# Patient Record
Sex: Male | Born: 1975
Health system: Southern US, Community
[De-identification: ages and names within clinical notes are randomized; demographics above are authoritative.]

## PROBLEM LIST (undated history)

## (undated) DIAGNOSIS — G479 Sleep disorder, unspecified: Secondary | ICD-10-CM

## (undated) DIAGNOSIS — F419 Anxiety disorder, unspecified: Secondary | ICD-10-CM

## (undated) DIAGNOSIS — I10 Essential (primary) hypertension: Secondary | ICD-10-CM

## (undated) HISTORY — PX: KNEE SURGERY: SHX244

## (undated) HISTORY — PX: OTHER SURGICAL HISTORY: SHX169

## (undated) HISTORY — DX: Anxiety disorder, unspecified: F41.9

## (undated) HISTORY — PX: TONSILLECTOMY AND ADENOIDECTOMY: SUR1326

## (undated) HISTORY — DX: Sleep disorder, unspecified: G47.9

## (undated) HISTORY — DX: Essential (primary) hypertension: I10

## (undated) HISTORY — PX: KIDNEY STONE SURGERY: SHX686

---

## 2015-04-26 ENCOUNTER — Telehealth: Payer: Self-pay | Admitting: Family Medicine

## 2015-04-26 NOTE — Telephone Encounter (Signed)
Appointment given for Friday 6/10 @ 2:55 with Stacks.

## 2015-05-04 ENCOUNTER — Encounter (INDEPENDENT_AMBULATORY_CARE_PROVIDER_SITE_OTHER): Payer: Self-pay

## 2015-05-04 ENCOUNTER — Ambulatory Visit (INDEPENDENT_AMBULATORY_CARE_PROVIDER_SITE_OTHER): Payer: BLUE CROSS/BLUE SHIELD | Admitting: Family Medicine

## 2015-05-04 ENCOUNTER — Encounter: Payer: Self-pay | Admitting: Family Medicine

## 2015-05-04 VITALS — BP 103/61 | HR 79 | Temp 97.1°F | Ht 70.5 in | Wt 215.0 lb

## 2015-05-04 DIAGNOSIS — M1A09X0 Idiopathic chronic gout, multiple sites, without tophus (tophi): Secondary | ICD-10-CM | POA: Diagnosis not present

## 2015-05-04 DIAGNOSIS — I10 Essential (primary) hypertension: Secondary | ICD-10-CM | POA: Diagnosis not present

## 2015-05-04 LAB — POCT CBC
GRANULOCYTE PERCENT: 59.3 % (ref 37–80)
HCT, POC: 44.2 % (ref 43.5–53.7)
HEMOGLOBIN: 14.6 g/dL (ref 14.1–18.1)
Lymph, poc: 2.6 (ref 0.6–3.4)
MCH, POC: 30.6 pg (ref 27–31.2)
MCHC: 33.1 g/dL (ref 31.8–35.4)
MCV: 92.4 fL (ref 80–97)
MPV: 6.9 fL (ref 0–99.8)
POC Granulocyte: 4.6 (ref 2–6.9)
POC LYMPH PERCENT: 33.8 %L (ref 10–50)
Platelet Count, POC: 223 10*3/uL (ref 142–424)
RBC: 4.79 M/uL (ref 4.69–6.13)
RDW, POC: 12.8 %
WBC: 7.8 10*3/uL (ref 4.6–10.2)

## 2015-05-04 MED ORDER — DILTIAZEM HCL ER COATED BEADS 360 MG PO CP24: 360.0000 mg | ORAL_CAPSULE | Freq: Every day | ORAL | Status: DC

## 2015-05-04 MED ORDER — ALLOPURINOL 100 MG PO TABS: 100.0000 mg | ORAL_TABLET | Freq: Every day | ORAL | Status: DC

## 2015-05-04 MED ORDER — CLONAZEPAM 1 MG PO TABS: ORAL_TABLET | ORAL | Status: DC

## 2015-05-04 MED ORDER — VALSARTAN-HYDROCHLOROTHIAZIDE 320-12.5 MG PO TABS: 1.0000 | ORAL_TABLET | Freq: Every morning | ORAL | Status: DC

## 2015-05-04 NOTE — Progress Notes (Signed)
Subjective:  Patient ID: Philip Shepard, male    DOB: Apr 21, 1976  Age: 39 y.o. MRN: [id number]  CC: Follow-up   HPI [name] presents for  follow-up of hypertension. Patient has no history of headache chest pain or shortness of breath or recent cough. Patient also denies symptoms of TIA such as numbness weakness lateralizing. Patient checks  blood pressure at home and has not had any elevated readings recently. Patient denies side effects from his medication. States taking it regularly.  Patient also is under treatment for chronic anxiety. He continues to take clonazepam. As long as he takes this not only doesn't sleep well but he has relief from his chronic anxiety concerns.   History Philip Shepard has a past medical history of Hypertension; Sleep disorder; and Anxiety.   He has past surgical history that includes Knee surgery (Left); carpel tunnel left and right; Kidney stone surgery; and Tonsillectomy and adenoidectomy.   His family history includes Arthritis in his father and mother; Cancer in his mother and paternal grandmother; Diabetes in his mother.He reports that he has been smoking Cigarettes.  He does not have any smokeless tobacco history on file. He reports that he does not drink alcohol or use illicit drugs.  No current outpatient prescriptions on file prior to visit.   No current facility-administered medications on file prior to visit.    ROS Review of Systems  Constitutional: Negative for fever, chills and diaphoresis.  HENT: Negative for congestion, rhinorrhea and sore throat.   Respiratory: Negative for cough, shortness of breath and wheezing.   Cardiovascular: Negative for chest pain.  Gastrointestinal: Negative for nausea, vomiting, abdominal pain, diarrhea, constipation and abdominal distention.  Genitourinary: Negative for dysuria and frequency.  Musculoskeletal: Negative for joint swelling and arthralgias.  Skin: Negative for rash.  Neurological: Negative for  headaches.    Objective:  BP 103/61 mmHg  Pulse 79  Temp(Src) 97.1 F (36.2 C) (Oral)  Ht 5' 10.5" (1.791 m)  Wt 215 lb (97.523 kg)  BMI 30.40 kg/m2  BP Readings from Last 3 Encounters:  05/04/15 103/61    Wt Readings from Last 3 Encounters:  05/04/15 215 lb (97.523 kg)     Physical Exam  Constitutional: He is oriented to person, place, and time. He appears well-developed and well-nourished. No distress.  HENT:  Head: Normocephalic and atraumatic.  Right Ear: External ear normal.  Left Ear: External ear normal.  Nose: Nose normal.  Mouth/Throat: Oropharynx is clear and moist.  Eyes: Conjunctivae and EOM are normal. Pupils are equal, round, and reactive to light.  Neck: Normal range of motion. Neck supple. No thyromegaly present.  Cardiovascular: Normal rate, regular rhythm and normal heart sounds.   No murmur heard. Pulmonary/Chest: Effort normal and breath sounds normal. No respiratory distress. He has no wheezes. He has no rales.  Abdominal: Soft. Bowel sounds are normal. He exhibits no distension. There is no tenderness.  Lymphadenopathy:    He has no cervical adenopathy.  Neurological: He is alert and oriented to person, place, and time. He has normal reflexes.  Skin: Skin is warm and dry.  Psychiatric: He has a normal mood and affect. His behavior is normal. Judgment and thought content normal.    No results found for: HGBA1C  No results found for: WBC, HGB, HCT, PLT, GLUCOSE, CHOL, TRIG, HDL, LDLDIRECT, LDLCALC, ALT, AST, NA, K, CL, CREATININE, BUN, CO2, TSH, PSA, INR, GLUF, HGBA1C, MICROALBUR  Patient was never admitted.  Assessment & Plan:   Quinterrius was  seen today for follow-up.  Diagnoses and all orders for this visit:  Essential hypertension Orders: -     POCT CBC -     CMP14+EGFR  Idiopathic chronic gout of multiple sites without tophus Orders: -     Uric acid  Other orders -     valsartan-hydrochlorothiazide (DIOVAN-HCT) 320-12.5 MG per tablet;  Take 1 tablet by mouth every morning. -     diltiazem (CARDIZEM CD) 360 MG 24 hr capsule; Take 1 capsule (360 mg total) by mouth daily. -     clonazePAM (KLONOPIN) 1 MG tablet; TAKE 1 TO 1 & 1/2 TABS APPROXIMATELY 30 MINUTES TO 1 HOUR BEFORE BEDTIME -     allopurinol (ZYLOPRIM) 100 MG tablet; Take 1 tablet (100 mg total) by mouth daily.  I have changed Mr. Philip Shepard's diltiazem and allopurinol. I am also having him maintain his valsartan-hydrochlorothiazide and clonazePAM.  Meds ordered this encounter  Medications  . DISCONTD: diltiazem (CARDIZEM CD) 360 MG 24 hr capsule    Sig: Take 1 capsule by mouth daily.    Refill:  11  . DISCONTD: clonazePAM (KLONOPIN) 1 MG tablet    Sig: TAKE 1 TO 1 & 1/2 TABS APPROXIMATELY 30 MINUTES TO 1 HOUR BEFORE BEDTIME    Refill:  5  . DISCONTD: allopurinol (ZYLOPRIM) 100 MG tablet    Sig: Take 1 tablet by mouth daily.    Refill:  1  . DISCONTD: valsartan-hydrochlorothiazide (DIOVAN-HCT) 320-12.5 MG per tablet    Sig: Take 1 tablet by mouth every morning.    Refill:  3  . valsartan-hydrochlorothiazide (DIOVAN-HCT) 320-12.5 MG per tablet    Sig: Take 1 tablet by mouth every morning.    Dispense:  90 tablet    Refill:  3  . diltiazem (CARDIZEM CD) 360 MG 24 hr capsule    Sig: Take 1 capsule (360 mg total) by mouth daily.    Dispense:  90 capsule    Refill:  3  . clonazePAM (KLONOPIN) 1 MG tablet    Sig: TAKE 1 TO 1 & 1/2 TABS APPROXIMATELY 30 MINUTES TO 1 HOUR BEFORE BEDTIME    Dispense:  90 tablet    Refill:  1  . allopurinol (ZYLOPRIM) 100 MG tablet    Sig: Take 1 tablet (100 mg total) by mouth daily.    Dispense:  90 tablet    Refill:  3     Follow-up: No Follow-up on file.  Claretta Fraise, M.D.

## 2015-05-05 LAB — CMP14+EGFR
ALT: 23 IU/L (ref 0–44)
AST: 17 IU/L (ref 0–40)
Albumin/Globulin Ratio: 1.8 (ref 1.1–2.5)
Albumin: 4.5 g/dL (ref 3.5–5.5)
Alkaline Phosphatase: 64 IU/L (ref 39–117)
BILIRUBIN TOTAL: 0.3 mg/dL (ref 0.0–1.2)
BUN / CREAT RATIO: 17 (ref 8–19)
BUN: 17 mg/dL (ref 6–20)
CALCIUM: 9.5 mg/dL (ref 8.7–10.2)
CO2: 25 mmol/L (ref 18–29)
CREATININE: 1.02 mg/dL (ref 0.76–1.27)
Chloride: 99 mmol/L (ref 97–108)
GFR calc Af Amer: 109 mL/min/{1.73_m2} (ref >59)
GFR, EST NON AFRICAN AMERICAN: 94 mL/min/{1.73_m2} (ref >59)
GLOBULIN, TOTAL: 2.5 g/dL (ref 1.5–4.5)
Glucose: 69 mg/dL (ref 65–99)
Potassium: 4.5 mmol/L (ref 3.5–5.2)
Sodium: 141 mmol/L (ref 134–144)
Total Protein: 7 g/dL (ref 6.0–8.5)

## 2015-05-05 LAB — URIC ACID: Uric Acid: 7.6 mg/dL (ref 3.7–8.6)

## 2015-05-07 ENCOUNTER — Telehealth: Payer: Self-pay | Admitting: *Deleted

## 2015-05-07 NOTE — Telephone Encounter (Signed)
-----   Message from Mechele Claude, MD sent at 05/05/2015 12:29 PM EDT ----- Nelda Severe,    Your lab result is normal.Some minor variations that are not significant are commonly marked abnormal, but do not represent any medical problem for you.  Best regards, Mechele Claude, M.D.

## 2015-05-07 NOTE — Progress Notes (Signed)
PATIENT AWARE

## 2015-05-07 NOTE — Telephone Encounter (Signed)
lmtcb regarding test results. 

## 2015-09-14 ENCOUNTER — Ambulatory Visit (INDEPENDENT_AMBULATORY_CARE_PROVIDER_SITE_OTHER): Payer: BLUE CROSS/BLUE SHIELD | Admitting: Family Medicine

## 2015-09-14 ENCOUNTER — Encounter: Payer: Self-pay | Admitting: Family Medicine

## 2015-09-14 ENCOUNTER — Ambulatory Visit (INDEPENDENT_AMBULATORY_CARE_PROVIDER_SITE_OTHER): Payer: BLUE CROSS/BLUE SHIELD

## 2015-09-14 VITALS — BP 108/61 | HR 70 | Temp 97.2°F | Ht 70.5 in | Wt 211.4 lb

## 2015-09-14 DIAGNOSIS — M5442 Lumbago with sciatica, left side: Secondary | ICD-10-CM

## 2015-09-14 DIAGNOSIS — I10 Essential (primary) hypertension: Secondary | ICD-10-CM

## 2015-09-14 DIAGNOSIS — Z23 Encounter for immunization: Secondary | ICD-10-CM | POA: Diagnosis not present

## 2015-09-14 DIAGNOSIS — F411 Generalized anxiety disorder: Secondary | ICD-10-CM | POA: Diagnosis not present

## 2015-09-14 MED ORDER — DILTIAZEM HCL ER COATED BEADS 360 MG PO CP24: 360.0000 mg | ORAL_CAPSULE | Freq: Every day | ORAL | Status: DC

## 2015-09-14 MED ORDER — CYCLOBENZAPRINE HCL 10 MG PO TABS: 10.0000 mg | ORAL_TABLET | Freq: Three times a day (TID) | ORAL | Status: DC | PRN

## 2015-09-14 MED ORDER — CLONAZEPAM 1 MG PO TABS: ORAL_TABLET | ORAL | Status: DC

## 2015-09-14 MED ORDER — METHYLPREDNISOLONE 8 MG PO TABS: ORAL_TABLET | ORAL | Status: DC

## 2015-09-14 MED ORDER — ALLOPURINOL 100 MG PO TABS: 100.0000 mg | ORAL_TABLET | Freq: Every day | ORAL | Status: DC

## 2015-09-14 MED ORDER — TRAMADOL HCL 50 MG PO TABS: 50.0000 mg | ORAL_TABLET | Freq: Four times a day (QID) | ORAL | Status: DC | PRN

## 2015-09-14 MED ORDER — VALSARTAN-HYDROCHLOROTHIAZIDE 320-12.5 MG PO TABS: 1.0000 | ORAL_TABLET | Freq: Every morning | ORAL | Status: DC

## 2015-09-14 NOTE — Progress Notes (Signed)
Subjective:  Patient ID: Philip Shepard, male    DOB: March 18, 1976  Age: 39 y.o. MRN: [id number]  CC: Hypertension and Insomnia   HPI Philip Shepard presents for sciatic nerve pain. Left buttocks pain is lancinating running down the back of the left thigh. It is 6/10. It is interfering with sleep. He is tired all the time. Patient reports that he is also stressed out and anxious and upset because of his daughter beginning her periods and she is having dysmenorrhea and wanting to spend more time with her mother. Since she is daddy's girl this is hurting his feelings and he is also worried about her pain, of course.   follow-up of hypertension. Patient has no history of headache chest pain or shortness of breath or recent cough. Patient also denies symptoms of TIA such as numbness weakness lateralizing. Patient checks  blood pressure at home and has not had any elevated readings recently. Patient denies side effects from his medication. States taking it regularly.   History Philip Shepard has a past medical history of Hypertension; Sleep disorder; and Anxiety.   He has past surgical history that includes Knee surgery (Left); carpel tunnel left and right; Kidney stone surgery; and Tonsillectomy and adenoidectomy.   His family history includes Arthritis in his father and mother; Cancer in his mother and paternal grandmother; Diabetes in his mother.He reports that he has been smoking Cigarettes.  He does not have any smokeless tobacco history on file. He reports that he does not drink alcohol or use illicit drugs.  Outpatient Prescriptions Prior to Visit  Medication Sig Dispense Refill  . allopurinol (ZYLOPRIM) 100 MG tablet Take 1 tablet (100 mg total) by mouth daily. 90 tablet 3  . clonazePAM (KLONOPIN) 1 MG tablet TAKE 1 TO 1 & 1/2 TABS APPROXIMATELY 30 MINUTES TO 1 HOUR BEFORE BEDTIME 90 tablet 1  . diltiazem (CARDIZEM CD) 360 MG 24 hr capsule Take 1 capsule (360 mg total) by mouth daily. 90 capsule 3  .  valsartan-hydrochlorothiazide (DIOVAN-HCT) 320-12.5 MG per tablet Take 1 tablet by mouth every morning. 90 tablet 3   No facility-administered medications prior to visit.    ROS Review of Systems  Constitutional: Negative for fever, chills and diaphoresis.  HENT: Negative for congestion, rhinorrhea and sore throat.   Respiratory: Negative for cough, shortness of breath and wheezing.   Cardiovascular: Negative for chest pain.  Gastrointestinal: Negative for nausea, vomiting, abdominal pain, diarrhea, constipation and abdominal distention.  Genitourinary: Negative for dysuria and frequency.  Musculoskeletal: Positive for myalgias and back pain. Negative for joint swelling and arthralgias.  Skin: Negative for rash.  Neurological: Positive for numbness (, Accompanying sciatic pain). Negative for headaches.  Psychiatric/Behavioral: Positive for sleep disturbance and agitation. The patient is nervous/anxious.     Objective:  BP 108/61 mmHg  Pulse 70  Temp(Src) 97.2 F (36.2 C) (Oral)  Ht 5' 10.5" (1.791 m)  Wt 211 lb 6.4 oz (95.89 kg)  BMI 29.89 kg/m2  BP Readings from Last 3 Encounters:  09/14/15 108/61  05/04/15 103/61    Wt Readings from Last 3 Encounters:  09/14/15 211 lb 6.4 oz (95.89 kg)  05/04/15 215 lb (97.523 kg)     Physical Exam  No results found for: HGBA1C  Lab Results  Component Value Date   WBC 7.8 05/04/2015   HGB 14.6 05/04/2015   HCT 44.2 05/04/2015   GLUCOSE 69 05/04/2015   ALT 23 05/04/2015   AST 17 05/04/2015   NA 141 05/04/2015  K 4.5 05/04/2015   CL 99 05/04/2015   CREATININE 1.02 05/04/2015   BUN 17 05/04/2015   CO2 25 05/04/2015    Patient was never admitted.  Assessment & Plan:   Philip Shepard was seen today for hypertension and insomnia.  Diagnoses and all orders for this visit:  Encounter for immunization  Left-sided low back pain with left-sided sciatica -     DG Lumbar Spine 2-3 Views; Future -     Nerve conduction test;  Future  Essential hypertension -     DG Lumbar Spine 2-3 Views; Future  Generalized anxiety disorder -     DG Lumbar Spine 2-3 Views; Future  Other orders -     clonazePAM (KLONOPIN) 1 MG tablet; TAKE 1 TO 1 & 1/2 TABS APPROXIMATELY 30 MINUTES TO 1 HOUR BEFORE BEDTIME -     Flu Vaccine QUAD 36+ mos IM -     diltiazem (CARDIZEM CD) 360 MG 24 hr capsule; Take 1 capsule (360 mg total) by mouth daily. -     valsartan-hydrochlorothiazide (DIOVAN-HCT) 320-12.5 MG tablet; Take 1 tablet by mouth every morning. -     allopurinol (ZYLOPRIM) 100 MG tablet; Take 1 tablet (100 mg total) by mouth daily. -     traMADol (ULTRAM) 50 MG tablet; Take 1 tablet (50 mg total) by mouth every 6 (six) hours as needed. -     methylPREDNISolone (MEDROL) 8 MG tablet; Take 5 daily for 3 days then 4 for 3 days then 3, 2, 1 for 3 days each -     cyclobenzaprine (FLEXERIL) 10 MG tablet; Take 1 tablet (10 mg total) by mouth 3 (three) times daily as needed for muscle spasms.   I have changed Philip Shepard's valsartan-hydrochlorothiazide. I am also having him start on traMADol, methylPREDNISolone, and cyclobenzaprine. Additionally, I am having him maintain his clonazePAM, diltiazem, and allopurinol.  Meds ordered this encounter  Medications  . clonazePAM (KLONOPIN) 1 MG tablet    Sig: TAKE 1 TO 1 & 1/2 TABS APPROXIMATELY 30 MINUTES TO 1 HOUR BEFORE BEDTIME    Dispense:  90 tablet    Refill:  1  . diltiazem (CARDIZEM CD) 360 MG 24 hr capsule    Sig: Take 1 capsule (360 mg total) by mouth daily.    Dispense:  90 capsule    Refill:  3  . valsartan-hydrochlorothiazide (DIOVAN-HCT) 320-12.5 MG tablet    Sig: Take 1 tablet by mouth every morning.    Dispense:  90 tablet    Refill:  3  . allopurinol (ZYLOPRIM) 100 MG tablet    Sig: Take 1 tablet (100 mg total) by mouth daily.    Dispense:  90 tablet    Refill:  3  . traMADol (ULTRAM) 50 MG tablet    Sig: Take 1 tablet (50 mg total) by mouth every 6 (six) hours as needed.     Dispense:  50 tablet    Refill:  1  . methylPREDNISolone (MEDROL) 8 MG tablet    Sig: Take 5 daily for 3 days then 4 for 3 days then 3, 2, 1 for 3 days each    Dispense:  45 tablet    Refill:  0  . cyclobenzaprine (FLEXERIL) 10 MG tablet    Sig: Take 1 tablet (10 mg total) by mouth 3 (three) times daily as needed for muscle spasms.    Dispense:  90 tablet    Refill:  1     Follow-up: Return in about 2 weeks (  around 09/28/2015) for Pain.  Mechele Claude, M.D.

## 2015-09-14 NOTE — Patient Instructions (Signed)

## 2016-01-25 ENCOUNTER — Other Ambulatory Visit: Payer: Self-pay | Admitting: Family Medicine

## 2016-01-28 NOTE — Telephone Encounter (Signed)
Last seen and filled 09/14/15. Call in

## 2016-01-29 MED ORDER — CLONAZEPAM 1 MG PO TABS: ORAL_TABLET | ORAL | Status: DC

## 2016-01-29 MED ORDER — DILTIAZEM HCL ER COATED BEADS 360 MG PO CP24: 360.0000 mg | ORAL_CAPSULE | Freq: Every day | ORAL | Status: DC

## 2016-01-29 MED ORDER — VALSARTAN-HYDROCHLOROTHIAZIDE 320-12.5 MG PO TABS: 1.0000 | ORAL_TABLET | Freq: Every morning | ORAL | Status: DC

## 2016-01-29 MED ORDER — ALLOPURINOL 100 MG PO TABS: 100.0000 mg | ORAL_TABLET | Freq: Every day | ORAL | Status: DC

## 2016-01-29 NOTE — Telephone Encounter (Signed)
Refill called to [facility] 

## 2016-02-15 ENCOUNTER — Ambulatory Visit: Payer: BLUE CROSS/BLUE SHIELD | Admitting: Family Medicine

## 2016-02-22 ENCOUNTER — Other Ambulatory Visit: Payer: Self-pay | Admitting: Family Medicine

## 2016-02-22 ENCOUNTER — Ambulatory Visit (INDEPENDENT_AMBULATORY_CARE_PROVIDER_SITE_OTHER): Payer: BLUE CROSS/BLUE SHIELD | Admitting: Family Medicine

## 2016-02-22 ENCOUNTER — Encounter: Payer: Self-pay | Admitting: Family Medicine

## 2016-02-22 VITALS — BP 116/63 | HR 95 | Temp 97.8°F | Ht 70.5 in | Wt 214.6 lb

## 2016-02-22 DIAGNOSIS — I10 Essential (primary) hypertension: Secondary | ICD-10-CM

## 2016-02-22 DIAGNOSIS — M5442 Lumbago with sciatica, left side: Secondary | ICD-10-CM

## 2016-02-22 DIAGNOSIS — F411 Generalized anxiety disorder: Secondary | ICD-10-CM | POA: Diagnosis not present

## 2016-02-22 MED ORDER — BUSPIRONE HCL 15 MG PO TABS: 15.0000 mg | ORAL_TABLET | Freq: Two times a day (BID) | ORAL | Status: DC

## 2016-02-22 MED ORDER — METAXALONE 800 MG PO TABS: 800.0000 mg | ORAL_TABLET | Freq: Three times a day (TID) | ORAL | Status: DC

## 2016-02-22 MED ORDER — METHYLPREDNISOLONE 8 MG PO TABS: ORAL_TABLET | ORAL | Status: DC

## 2016-02-22 NOTE — Progress Notes (Signed)
Subjective:  Patient ID: Philip Shepard, male    DOB: 05-31-1976  Age: 40 y.o. MRN: [id number]  CC: Hypertension and GAD   HPI Philip Shepard presents for  follow-up of hypertension. Patient has no history of headache chest pain or shortness of breath or recent cough. Patient also denies symptoms of TIA such as numbness weakness lateralizing. Patient checks  blood pressure at home and has not had any elevated readings recently. Patient denies side effects from medication. States taking it regularly.  GAD 7 : Generalized Anxiety Score 02/22/2016  Nervous, Anxious, on Edge 3  Control/stop worrying 3  Worry too much - different things 3  Trouble relaxing 3  Restless 3  Easily annoyed or irritable 3  Afraid - awful might happen 0  Total GAD 7 Score 18  Anxiety Difficulty Somewhat difficult     Anxiety has been heightened significantly by his mother's recent diagnosis of breast cancer. He feels that the surgeon is dragging his feet about operating. He doesn't understand why she is seeing chemotherapy radiation and getting another MRI. Additionally anxiety is been heightened by his back pain.  Back pain remains in the left lower back. It is frequently 11/10. No relief with Flexeril or tramadol. The steroid helped a lot but it started coming back within a week after finishing the steroid. He tried physical therapy and that seemed to make it worse. He is doing some working out in the mornings before work doing some stretches and light weightlifting as well as cardio. That seems to give him enough flexibility and relief so that he can make it through his work shift. Sometimes he can get relieved just by sitting still and getting comfortable in a recliner with the pillow that just so behind his back. Of note is that the radiation is down the left buttocks into the posterior thigh. It also rates 11/10. Pain is sharp and stabbing at the paraspinous L4 region where he points.   History Philip Shepard has a past  medical history of Hypertension; Sleep disorder; and Anxiety.   He has past surgical history that includes Knee surgery (Left); carpel tunnel left and right; Kidney stone surgery; and Tonsillectomy and adenoidectomy.   His family history includes Arthritis in his father and mother; Cancer in his mother and paternal grandmother; Diabetes in his mother.He reports that he has been smoking Cigarettes.  He does not have any smokeless tobacco history on file. He reports that he does not drink alcohol or use illicit drugs.  Current Outpatient Prescriptions on File Prior to Visit  Medication Sig Dispense Refill  . allopurinol (ZYLOPRIM) 100 MG tablet Take 1 tablet (100 mg total) by mouth daily. 90 tablet 0  . clonazePAM (KLONOPIN) 1 MG tablet TAKE 1 TO 1 & 1/2 TABS APPROXIMATELY 30 MINUTES TO 1 HOUR BEFORE BEDTIME 90 tablet 1  . diltiazem (CARDIZEM CD) 360 MG 24 hr capsule Take 1 capsule (360 mg total) by mouth daily. 90 capsule 0  . valsartan-hydrochlorothiazide (DIOVAN-HCT) 320-12.5 MG tablet Take 1 tablet by mouth every morning. 90 tablet 0   No current facility-administered medications on file prior to visit.    ROS Review of Systems  Constitutional: Negative for fever, chills and diaphoresis.  HENT: Negative for rhinorrhea and sore throat.   Respiratory: Negative for cough and shortness of breath.   Cardiovascular: Negative for chest pain.  Gastrointestinal: Negative for abdominal pain.  Musculoskeletal: Negative for myalgias and arthralgias.  Skin: Negative for rash.  Neurological: Negative for weakness  and headaches.    Objective:  BP 116/63 mmHg  Pulse 95  Temp(Src) 97.8 F (36.6 C) (Oral)  Ht 5' 10.5" (1.791 m)  Wt 214 lb 9.6 oz (97.342 kg)  BMI 30.35 kg/m2  SpO2 98%  BP Readings from Last 3 Encounters:  02/22/16 116/63  09/14/15 108/61  05/04/15 103/61    Wt Readings from Last 3 Encounters:  02/22/16 214 lb 9.6 oz (97.342 kg)  09/14/15 211 lb 6.4 oz (95.89 kg)    05/04/15 215 lb (97.523 kg)     Physical Exam  Constitutional: He is oriented to person, place, and time. He appears well-developed and well-nourished. No distress.  HENT:  Head: Normocephalic and atraumatic.  Right Ear: External ear normal.  Left Ear: External ear normal.  Nose: Nose normal.  Mouth/Throat: Oropharynx is clear and moist.  Eyes: Conjunctivae and EOM are normal. Pupils are equal, round, and reactive to light.  Neck: Normal range of motion. Neck supple. No thyromegaly present.  Cardiovascular: Normal rate, regular rhythm and normal heart sounds.   No murmur heard. Pulmonary/Chest: Effort normal and breath sounds normal. No respiratory distress. He has no wheezes. He has no rales.  Abdominal: Soft. Bowel sounds are normal. He exhibits no distension. There is no tenderness.  Musculoskeletal: He exhibits tenderness (marked tenderness with palpable spasm in the lumbar spine hours at L4 level. There is a positive straight leg raise on the left. The left lower extremity is neurovascularly intact.).  Lymphadenopathy:    He has no cervical adenopathy.  Neurological: He is alert and oriented to person, place, and time. He has normal reflexes.  Skin: Skin is warm and dry.  Psychiatric: He has a normal mood and affect. His behavior is normal. Judgment and thought content normal.     Lab Results  Component Value Date   WBC 7.8 05/04/2015   HGB 14.6 05/04/2015   HCT 44.2 05/04/2015   GLUCOSE 69 05/04/2015   ALT 23 05/04/2015   AST 17 05/04/2015   NA 141 05/04/2015   K 4.5 05/04/2015   CL 99 05/04/2015   CREATININE 1.02 05/04/2015   BUN 17 05/04/2015   CO2 25 05/04/2015    Patient was never admitted.  Assessment & Plan:   Philip Shepard was seen today for hypertension and gad.  Diagnoses and all orders for this visit:  Left-sided low back pain with left-sided sciatica -     MR Lumbar Spine Wo Contrast; Future -     Ambulatory referral to Orthopedic Surgery  Essential  hypertension  Generalized anxiety disorder  Other orders -     metaxalone (SKELAXIN) 800 MG tablet; Take 1 tablet (800 mg total) by mouth 3 (three) times daily. To relax muscles -     busPIRone (BUSPAR) 15 MG tablet; Take 1 tablet (15 mg total) by mouth 2 (two) times daily. For anxiety -     methylPREDNISolone (MEDROL) 8 MG tablet; Take 5 daily for 3 days then 4 for 3 days then 3, 2, 1 for 3 days each   I have discontinued Philip Shepard's traMADol and cyclobenzaprine. I am also having him start on metaxalone and busPIRone. Additionally, I am having him maintain his valsartan-hydrochlorothiazide, diltiazem, allopurinol, clonazePAM, and methylPREDNISolone.  Meds ordered this encounter  Medications  . metaxalone (SKELAXIN) 800 MG tablet    Sig: Take 1 tablet (800 mg total) by mouth 3 (three) times daily. To relax muscles    Dispense:  90 tablet    Refill:  5  .  busPIRone (BUSPAR) 15 MG tablet    Sig: Take 1 tablet (15 mg total) by mouth 2 (two) times daily. For anxiety    Dispense:  60 tablet    Refill:  2  . methylPREDNISolone (MEDROL) 8 MG tablet    Sig: Take 5 daily for 3 days then 4 for 3 days then 3, 2, 1 for 3 days each    Dispense:  45 tablet    Refill:  0      Follow-up: Return in about 6 weeks (around 04/04/2016).  Mechele Claude, M.D.

## 2016-04-01 ENCOUNTER — Telehealth: Payer: Self-pay | Admitting: Family Medicine

## 2016-04-01 NOTE — Telephone Encounter (Signed)
I will contact him after finishing seeing patients

## 2016-04-01 NOTE — Telephone Encounter (Signed)
Thank you :)

## 2016-04-02 ENCOUNTER — Telehealth: Payer: Self-pay | Admitting: Family Medicine

## 2016-04-02 NOTE — Telephone Encounter (Signed)
Spoke to pt's wife and gave recommendation per Dr Darlyn ReadStacks She would like for Dr Darlyn ReadStacks to call

## 2016-04-02 NOTE — Telephone Encounter (Signed)
Tell her that Philip Shepard has 2 herniated discs that are squeezing nerves in his back. I recommended he see a neurosurgeon. He said he wanted to think about it and would let me know what he decides. Referral hasn't been made yet. Waiting on him.

## 2016-04-04 ENCOUNTER — Other Ambulatory Visit: Payer: Self-pay | Admitting: Family Medicine

## 2016-04-04 DIAGNOSIS — M5416 Radiculopathy, lumbar region: Secondary | ICD-10-CM

## 2016-04-10 ENCOUNTER — Encounter: Payer: Self-pay | Admitting: Family Medicine

## 2016-04-23 ENCOUNTER — Telehealth: Payer: Self-pay | Admitting: Family Medicine

## 2016-04-23 NOTE — Telephone Encounter (Signed)
Pt is requesting pain med for back pain Pain is continuing to get worse Please review and advise

## 2016-04-23 NOTE — Telephone Encounter (Signed)
In order to consider this patient request, the patient will need to see a provider, preferably their PCP. 

## 2016-04-23 NOTE — Telephone Encounter (Signed)
Informed wife of Dr Sharen HonesStack's recommendation Pt will call back to schedule appt

## 2016-04-25 ENCOUNTER — Ambulatory Visit (INDEPENDENT_AMBULATORY_CARE_PROVIDER_SITE_OTHER): Payer: BLUE CROSS/BLUE SHIELD | Admitting: Family Medicine

## 2016-04-25 ENCOUNTER — Encounter: Payer: Self-pay | Admitting: Family Medicine

## 2016-04-25 VITALS — BP 124/73 | HR 100 | Temp 97.9°F | Ht 71.0 in | Wt 215.4 lb

## 2016-04-25 DIAGNOSIS — M5126 Other intervertebral disc displacement, lumbar region: Secondary | ICD-10-CM | POA: Diagnosis not present

## 2016-04-25 MED ORDER — GABAPENTIN 300 MG PO CAPS: ORAL_CAPSULE | ORAL | Status: DC

## 2016-04-25 MED ORDER — HYDROCODONE-ACETAMINOPHEN 5-325 MG PO TABS: 1.0000 | ORAL_TABLET | Freq: Four times a day (QID) | ORAL | Status: DC | PRN

## 2016-04-25 NOTE — Progress Notes (Signed)
Subjective:  Patient ID: Philip Shepard, male    DOB: 03/20/1976  Age: 40 y.o. MRN: [id number]  CC: Back Pain   HPI Philip Shepard presents for 10/10 back pain radiating bilaterally into posterior thighs. Recent Dx with herniated NP with nerve root compression. Pt. Very concerned about surgery, but now says he has to do something. Needs short term pain relief until he can see neurosurgeon.Pain is relieved by laying down. It is exacerbated by being on his feet for an extended time daily. It is a pressure. He feels like there is a ball pushing into his back when he sits. He can't sit for very long. He can't stand for very long. He can't walk or ambulate for very long. He has to change position frequently. The pain is occurring at least 3 days a week sometimes every day.  History Philip Shepard has a past medical history of Hypertension; Sleep disorder; and Anxiety.   He has past surgical history that includes Knee surgery (Left); carpel tunnel left and right; Kidney stone surgery; and Tonsillectomy and adenoidectomy.   His family history includes Arthritis in his father and mother; Cancer in his mother and paternal grandmother; Diabetes in his mother.He reports that he has been smoking Cigarettes.  He does not have any smokeless tobacco history on file. He reports that he does not drink alcohol or use illicit drugs.  Current Outpatient Prescriptions on File Prior to Visit  Medication Sig Dispense Refill  . allopurinol (ZYLOPRIM) 100 MG tablet Take 1 tablet (100 mg total) by mouth daily. 90 tablet 0  . clonazePAM (KLONOPIN) 1 MG tablet TAKE 1 TO 1 & 1/2 TABS APPROXIMATELY 30 MINUTES TO 1 HOUR BEFORE BEDTIME 90 tablet 1  . diltiazem (CARDIZEM CD) 360 MG 24 hr capsule Take 1 capsule (360 mg total) by mouth daily. 90 capsule 0  . metaxalone (SKELAXIN) 800 MG tablet Take 1 tablet (800 mg total) by mouth 3 (three) times daily. To relax muscles 90 tablet 5  . valsartan-hydrochlorothiazide (DIOVAN-HCT) 320-12.5 MG  tablet Take 1 tablet by mouth every morning. 90 tablet 0  . busPIRone (BUSPAR) 15 MG tablet Take 1 tablet (15 mg total) by mouth 2 (two) times daily. For anxiety (Patient not taking: Reported on 04/25/2016) 60 tablet 2  . cyclobenzaprine (FLEXERIL) 10 MG tablet TAKE 1 TABLET (10 MG TOTAL) BY MOUTH 3 (THREE) TIMES DAILY AS NEEDED FOR MUSCLE SPASMS. (Patient not taking: Reported on 04/25/2016) 90 tablet 2   No current facility-administered medications on file prior to visit.    ROS Review of Systems  Constitutional: Negative for fever, chills, diaphoresis and unexpected weight change.  HENT: Negative for congestion, hearing loss, rhinorrhea and sore throat.   Eyes: Negative for visual disturbance.  Respiratory: Negative for cough and shortness of breath.   Cardiovascular: Negative for chest pain.  Gastrointestinal: Negative for abdominal pain, diarrhea and constipation.  Genitourinary: Negative for dysuria and flank pain.  Musculoskeletal: Positive for myalgias, back pain, joint swelling and arthralgias.  Skin: Negative for rash.  Neurological: Negative for dizziness and headaches.  Psychiatric/Behavioral: Negative for sleep disturbance and dysphoric mood.    Objective:  BP 124/73 mmHg  Pulse 100  Temp(Src) 97.9 F (36.6 C) (Oral)  Ht 5\' 11"  (1.803 m)  Wt 215 lb 6.4 oz (97.705 kg)  BMI 30.06 kg/m2  SpO2 99%  Physical Exam  Constitutional: He is oriented to person, place, and time. He appears well-developed and well-nourished. No distress.  HENT:  Head: Normocephalic and atraumatic.  Right Ear: External ear normal.  Left Ear: External ear normal.  Nose: Nose normal.  Mouth/Throat: Oropharynx is clear and moist.  Eyes: Conjunctivae and EOM are normal. Pupils are equal, round, and reactive to light.  Neck: Normal range of motion. Neck supple. No thyromegaly present.  Cardiovascular: Normal rate, regular rhythm and normal heart sounds.   No murmur heard. Pulmonary/Chest: Effort  normal and breath sounds normal. No respiratory distress. He has no wheezes. He has no rales.  Abdominal: Soft. Bowel sounds are normal. He exhibits no distension. There is no tenderness.  Lymphadenopathy:    He has no cervical adenopathy.  Neurological: He is alert and oriented to person, place, and time. He has normal reflexes.  Skin: Skin is warm and dry.  Psychiatric: He has a normal mood and affect. His behavior is normal. Judgment and thought content normal.    Assessment & Plan:   Taesean was seen today for back pain.  Diagnoses and all orders for this visit:  HNP (herniated nucleus pulposus), lumbar -     Ambulatory referral to Neurosurgery  Other orders -     HYDROcodone-acetaminophen (NORCO) 5-325 MG tablet; Take 1 tablet by mouth every 6 (six) hours as needed for moderate pain.   I have discontinued Mr. Charlet's methylPREDNISolone. I am also having him start on HYDROcodone-acetaminophen. Additionally, I am having him maintain his valsartan-hydrochlorothiazide, diltiazem, allopurinol, clonazePAM, metaxalone, busPIRone, and cyclobenzaprine.  Meds ordered this encounter  Medications  . HYDROcodone-acetaminophen (NORCO) 5-325 MG tablet    Sig: Take 1 tablet by mouth every 6 (six) hours as needed for moderate pain.    Dispense:  50 tablet    Refill:  0   Detailed discussion surrounding prognosis for the herniated disc and risk for opiate dependence placed on choosing medical relief versus surgical intervention.  Follow-up: Return in about 1 month (around 05/25/2016).  Mechele Claude, M.D.

## 2016-05-15 ENCOUNTER — Telehealth: Payer: Self-pay | Admitting: Family Medicine

## 2016-05-15 ENCOUNTER — Other Ambulatory Visit: Payer: Self-pay

## 2016-05-15 MED ORDER — ALLOPURINOL 100 MG PO TABS: 100.0000 mg | ORAL_TABLET | Freq: Every day | ORAL | Status: DC

## 2016-05-15 NOTE — Telephone Encounter (Signed)
done

## 2016-05-21 ENCOUNTER — Telehealth: Payer: Self-pay | Admitting: Family Medicine

## 2016-05-21 ENCOUNTER — Other Ambulatory Visit: Payer: Self-pay | Admitting: Family Medicine

## 2016-05-21 NOTE — Telephone Encounter (Signed)
Done per Doyne Keelhanda

## 2016-05-23 ENCOUNTER — Other Ambulatory Visit: Payer: Self-pay | Admitting: Family Medicine

## 2016-05-26 MED ORDER — CLONAZEPAM 1 MG PO TABS: ORAL_TABLET | ORAL | Status: DC

## 2016-05-26 NOTE — Telephone Encounter (Signed)
Last filled 03/29/16 for a two month supply. Last seen 02/22/16. Stacks pt. Call in at [phone]

## 2016-05-28 ENCOUNTER — Other Ambulatory Visit: Payer: Self-pay | Admitting: Family Medicine

## 2016-05-28 NOTE — Telephone Encounter (Signed)
RX called into [facility] per pt request Okayed per Dr Hyacinth MeekerMiller

## 2016-05-28 NOTE — Telephone Encounter (Signed)
Status?

## 2016-07-07 ENCOUNTER — Telehealth: Payer: Self-pay | Admitting: Family Medicine

## 2016-07-07 ENCOUNTER — Other Ambulatory Visit: Payer: Self-pay | Admitting: Family Medicine

## 2016-07-07 MED ORDER — VALSARTAN-HYDROCHLOROTHIAZIDE 320-12.5 MG PO TABS: 1.0000 | ORAL_TABLET | Freq: Every morning | ORAL | 0 refills | Status: DC

## 2016-07-07 NOTE — Telephone Encounter (Signed)
Pt needed refill on Diovan HCT Refill sent into [facility] per pt request Okayed per Dr Darlyn ReadStacks

## 2016-07-08 ENCOUNTER — Other Ambulatory Visit: Payer: Self-pay

## 2016-07-08 NOTE — Telephone Encounter (Signed)
Please review and advise.

## 2016-07-09 ENCOUNTER — Other Ambulatory Visit: Payer: Self-pay | Admitting: Family Medicine

## 2016-07-09 ENCOUNTER — Telehealth: Payer: Self-pay | Admitting: Family Medicine

## 2016-07-09 MED ORDER — CLONAZEPAM 1 MG PO TABS: ORAL_TABLET | ORAL | 2 refills | Status: DC

## 2016-07-09 NOTE — Telephone Encounter (Signed)
Spoke with pt's wife and she wants to only speak with you.

## 2016-09-08 ENCOUNTER — Other Ambulatory Visit: Payer: Self-pay | Admitting: Family Medicine

## 2016-10-01 ENCOUNTER — Other Ambulatory Visit: Payer: Self-pay | Admitting: Family Medicine

## 2016-10-01 NOTE — Telephone Encounter (Signed)
Last 6/17- stacks If approved send to pool for nurse to phone in

## 2016-10-01 NOTE — Telephone Encounter (Signed)
Authorize 30 days only. Then contact the patient letting them know that they will need an appointment before any further prescriptions can be sent in. 

## 2016-10-01 NOTE — Telephone Encounter (Signed)
[facility]ill called to [facility]. Pt has appt 11/24 per wife

## 2016-10-13 ENCOUNTER — Other Ambulatory Visit: Payer: Self-pay | Admitting: Family Medicine

## 2016-10-14 NOTE — Telephone Encounter (Signed)
Patient aware that prescriptions sent in, he does agree that Klonopin was sent in on 11/18.  He will check his work schedule and call us back to make appointment for followup.

## 2016-10-17 ENCOUNTER — Encounter: Payer: BLUE CROSS/BLUE SHIELD | Admitting: Family Medicine

## 2016-11-28 ENCOUNTER — Encounter: Payer: BLUE CROSS/BLUE SHIELD | Admitting: Family Medicine

## 2016-12-05 ENCOUNTER — Ambulatory Visit (INDEPENDENT_AMBULATORY_CARE_PROVIDER_SITE_OTHER): Payer: BLUE CROSS/BLUE SHIELD | Admitting: Family Medicine

## 2016-12-05 ENCOUNTER — Encounter: Payer: Self-pay | Admitting: Family Medicine

## 2016-12-05 DIAGNOSIS — Z0000 Encounter for general adult medical examination without abnormal findings: Secondary | ICD-10-CM | POA: Diagnosis not present

## 2016-12-05 DIAGNOSIS — Z23 Encounter for immunization: Secondary | ICD-10-CM | POA: Diagnosis not present

## 2016-12-05 DIAGNOSIS — M5126 Other intervertebral disc displacement, lumbar region: Secondary | ICD-10-CM

## 2016-12-05 MED ORDER — CLONAZEPAM 1 MG PO TABS: ORAL_TABLET | ORAL | 5 refills | Status: DC

## 2016-12-05 MED ORDER — VALSARTAN-HYDROCHLOROTHIAZIDE 320-12.5 MG PO TABS: 1.0000 | ORAL_TABLET | Freq: Every morning | ORAL | 0 refills | Status: DC

## 2016-12-05 MED ORDER — ALLOPURINOL 100 MG PO TABS: 100.0000 mg | ORAL_TABLET | Freq: Every day | ORAL | 1 refills | Status: DC

## 2016-12-05 MED ORDER — DILTIAZEM HCL ER COATED BEADS 360 MG PO CP24: ORAL_CAPSULE | ORAL | 1 refills | Status: DC

## 2016-12-05 MED ORDER — HYDROCODONE-ACETAMINOPHEN 5-325 MG PO TABS: 1.0000 | ORAL_TABLET | Freq: Four times a day (QID) | ORAL | 0 refills | Status: DC | PRN

## 2016-12-05 NOTE — Progress Notes (Signed)
Subjective:  Patient ID: Philip Shepard, male    DOB: 1976-03-19  Age: 41 y.o. MRN: [id number]  CC: Annual Exam (pt here today for routine follow up and CPE and discuss her back pain)   HPI Yazir Koerber presents for Low back pain points to spinous musculature at the L4-5 region. Says radiates to the right buttock and down the right leg. He states he saw the spinal surgeon and was given exercises and shots in the back or the shots helped for about a week. He was sent to physical therapy as well. Currently he has been out of his hydrocodone for about 3 weeks. He made the bottle from last summer last for the entire time since his last visit then. 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 Radiates to the right buttock and right posterior thigh with a lancinating numbness. Patient is conflicted over seeing somebody else for potential surgery after being told he didn't need surgery as previous physician. However medical record review shows that he was told to come back if symptoms did not resolve. Although they have not resolved he did not return. This is in part due to the fact he was told he was not a surgical candidate. Additionally he doesn't understand why he thinks was placed on a dried out disc as opposed to the herniated disc.  [place] controlled substance Registry shows that he's been filling the clonazepam regularly but no fills for the hydrocodone within the last 6 months. No other physicians. Using only his home pharmacy at [facility].   History Thompson has a past medical history of Anxiety; Hypertension; and Sleep disorder.   He has a past surgical history that includes Knee surgery (Left); carpel tunnel left and right; Kidney stone surgery; and Tonsillectomy and adenoidectomy.   His family history includes Arthritis in his father and mother; Cancer in his mother and paternal grandmother; Diabetes in his mother.He reports that he  has been smoking Cigarettes.  He quit smokeless tobacco use about 3 months ago. He reports that he does not drink alcohol or use drugs.    ROS Review of Systems  Constitutional: Negative for chills, diaphoresis, fever and unexpected weight change.  HENT: Negative for congestion, hearing loss, rhinorrhea and sore throat.   Eyes: Negative for visual disturbance.  Respiratory: Negative for cough and shortness of breath.   Cardiovascular: Negative for chest pain.  Gastrointestinal: Negative for abdominal pain, constipation and diarrhea.  Genitourinary: Negative for dysuria and flank pain.  Musculoskeletal: Positive for arthralgias, back pain and myalgias. Negative for joint swelling.  Skin: Negative for rash.  Neurological: Negative for dizziness and headaches.  Psychiatric/Behavioral: Negative for dysphoric mood and sleep disturbance.    Objective:  BP 125/73   Pulse 95   Temp 97.2 F (36.2 C) (Oral)   Ht 5\' 11"  (1.803 m)   Wt 221 lb (100.2 kg)   BMI 30.82 kg/m   BP Readings from Last 3 Encounters:  12/05/16 125/73  04/25/16 124/73  02/22/16 116/63    Wt Readings from Last 3 Encounters:  12/05/16 221 lb (100.2 kg)  04/25/16 215 lb 6.4 oz (97.7 kg)  02/22/16 214 lb 9.6 oz (97.3 kg)     Physical Exam  Constitutional: He is oriented to person, place, and time. He appears well-developed and well-nourished. No distress.  HENT:  Head: Normocephalic and atraumatic.  Right Ear: External ear normal.  Left Ear: External ear normal.  Nose: Nose normal.  Mouth/Throat: Oropharynx is clear and moist.  Eyes: Conjunctivae and EOM are normal. Pupils are equal, round, and reactive to light.  Neck: Normal range of motion. Neck supple. No thyromegaly present.  Cardiovascular: Normal rate, regular rhythm and normal heart sounds.   No murmur heard. Pulmonary/Chest: Effort normal and breath sounds normal. No respiratory distress. He has no wheezes. He has no rales.  Abdominal: Soft.  Bowel sounds are normal. He exhibits no distension. There is no tenderness.  Lymphadenopathy:    He has no cervical adenopathy.  Neurological: He is alert and oriented to person, place, and time. He has normal reflexes.  Skin: Skin is warm and dry.  Psychiatric: He has a normal mood and affect. His behavior is normal. Judgment and thought content normal.    Patient was never admitted.  Assessment & Plan:   Rolm Galarik was seen today for annual exam.  Diagnoses and all orders for this visit:  HNP (herniated nucleus pulposus), lumbar -     ToxASSURE Select 13 (MW), Urine -     Ambulatory referral to Neurosurgery  Encounter for immunization -     Flu Vaccine QUAD 36+ mos IM  Other orders -     diltiazem (CARDIZEM CD) 360 MG 24 hr capsule; TAKE 1 CAPSULE (360 MG TOTAL) BY MOUTH DAILY. -     allopurinol (ZYLOPRIM) 100 MG tablet; Take 1 tablet (100 mg total) by mouth daily. -     HYDROcodone-acetaminophen (NORCO) 5-325 MG tablet; Take 1 tablet by mouth every 6 (six) hours as needed for moderate pain. -     valsartan-hydrochlorothiazide (DIOVAN-HCT) 320-12.5 MG tablet; Take 1 tablet by mouth every morning. -     clonazePAM (KLONOPIN) 1 MG tablet; TAKE 1 AND 1/2 TABLET AT BEDTIME    40 minutes of time was spent more than half of which was in discussion of the pros and cons of surgery and the dangers of long-term use of opiates.  I have discontinued Mr. Cadotte's metaxalone, busPIRone, and gabapentin. I am also having him maintain his cyclobenzaprine, diltiazem, allopurinol, HYDROcodone-acetaminophen, valsartan-hydrochlorothiazide, and clonazePAM.  Allergies as of 12/05/2016      Reactions   Prednisone    Rage, allergic reaction      Medication List       Accurate as of 12/05/16  9:13 PM. Always use your most recent med list.          allopurinol 100 MG tablet Commonly known as:  ZYLOPRIM Take 1 tablet (100 mg total) by mouth daily.   clonazePAM 1 MG tablet Commonly known as:   KLONOPIN TAKE 1 AND 1/2 TABLET AT BEDTIME   cyclobenzaprine 10 MG tablet Commonly known as:  FLEXERIL TAKE 1 TABLET (10 MG TOTAL) BY MOUTH 3 (THREE) TIMES DAILY AS NEEDED FOR MUSCLE SPASMS.   diltiazem 360 MG 24 hr capsule Commonly known as:  CARDIZEM CD TAKE 1 CAPSULE (360 MG TOTAL) BY MOUTH DAILY.   HYDROcodone-acetaminophen 5-325 MG tablet Commonly known as:  NORCO Take 1 tablet by mouth every 6 (six) hours as needed for moderate pain.   valsartan-hydrochlorothiazide 320-12.5 MG tablet Commonly known as:  DIOVAN-HCT Take 1 tablet by mouth every morning.        Follow-up: Return in about 3 months (around 03/05/2017) for Pain.  Mechele ClaudeWarren Lilyana Lippman, M.D.

## 2016-12-08 ENCOUNTER — Other Ambulatory Visit: Payer: Self-pay | Admitting: Pediatrics

## 2016-12-08 DIAGNOSIS — M5416 Radiculopathy, lumbar region: Secondary | ICD-10-CM

## 2016-12-16 ENCOUNTER — Other Ambulatory Visit: Payer: Self-pay | Admitting: Pediatrics

## 2017-01-02 ENCOUNTER — Encounter: Payer: Self-pay | Admitting: Family Medicine

## 2017-01-02 ENCOUNTER — Ambulatory Visit (INDEPENDENT_AMBULATORY_CARE_PROVIDER_SITE_OTHER): Payer: BLUE CROSS/BLUE SHIELD | Admitting: Family Medicine

## 2017-01-02 VITALS — BP 103/69 | HR 89 | Temp 97.0°F | Ht 71.0 in | Wt 222.0 lb

## 2017-01-02 DIAGNOSIS — I10 Essential (primary) hypertension: Secondary | ICD-10-CM

## 2017-01-02 DIAGNOSIS — Z0000 Encounter for general adult medical examination without abnormal findings: Secondary | ICD-10-CM

## 2017-01-02 DIAGNOSIS — E782 Mixed hyperlipidemia: Secondary | ICD-10-CM

## 2017-01-02 MED ORDER — HYDROCODONE-ACETAMINOPHEN 5-325 MG PO TABS: 1.0000 | ORAL_TABLET | Freq: Four times a day (QID) | ORAL | 0 refills | Status: DC | PRN

## 2017-01-02 NOTE — Progress Notes (Signed)
Subjective:  Patient ID: Philip Shepard, male    DOB: 12/15/1975  Age: 41 y.o. MRN: [id number]  CC: Annual Exam (pt here today for CPE)   HPI [name] presents for well adult exam. Back pain continues. Has used 30 Of the 50 hydrocodone pills prescribed last month. Pill count equals 20. Pain is significant but he continues to try to stretch to avoid pain and use of medication. He has been in contact with neurosurgery.. Consultation is planned soon.   History Dayln has a past medical history of Anxiety; Hypertension; and Sleep disorder.   He has a past surgical history that includes Knee surgery (Left); carpel tunnel left and right; Kidney stone surgery; and Tonsillectomy and adenoidectomy.   His family history includes Arthritis in his father and mother; Cancer in his mother and paternal grandmother; Diabetes in his mother.He reports that he has been smoking Cigarettes.  He quit smokeless tobacco use about 3 months ago. He reports that he does not drink alcohol or use drugs.    ROS Review of Systems  Constitutional: Negative for chills, diaphoresis, fever and unexpected weight change.  HENT: Negative for congestion, hearing loss, rhinorrhea and sore throat.   Eyes: Negative for visual disturbance.  Respiratory: Negative for cough and shortness of breath.   Cardiovascular: Negative for chest pain.  Gastrointestinal: Negative for abdominal pain, constipation and diarrhea.  Genitourinary: Negative for dysuria and flank pain.  Musculoskeletal: Negative for arthralgias and joint swelling.  Skin: Negative for rash.  Neurological: Negative for dizziness and headaches.  Psychiatric/Behavioral: Negative for dysphoric mood and sleep disturbance.    Objective:  BP 103/69   Pulse 89   Temp 97 F (36.1 C) (Oral)   Ht 5' 11"  (1.803 m)   Wt 222 lb (100.7 kg)   BMI 30.96 kg/m   BP Readings from Last 3 Encounters:  01/02/17 103/69  12/05/16 125/73  04/25/16 124/73    Wt Readings from  Last 3 Encounters:  01/02/17 222 lb (100.7 kg)  12/05/16 221 lb (100.2 kg)  04/25/16 215 lb 6.4 oz (97.7 kg)     Physical Exam  Constitutional: He is oriented to person, place, and time. He appears well-developed and well-nourished.  HENT:  Head: Normocephalic and atraumatic.  Mouth/Throat: Oropharynx is clear and moist.  Eyes: EOM are normal. Pupils are equal, round, and reactive to light.  Neck: Normal range of motion. No tracheal deviation present. No thyromegaly present.  Cardiovascular: Normal rate, regular rhythm and normal heart sounds.  Exam reveals no gallop and no friction rub.   No murmur heard. Pulmonary/Chest: Breath sounds normal. He has no wheezes. He has no rales.  Abdominal: Soft. He exhibits no mass. There is no tenderness.  Musculoskeletal: Normal range of motion. He exhibits no edema.  Neurological: He is alert and oriented to person, place, and time.  Skin: Skin is warm and dry.  Psychiatric: He has a normal mood and affect.    Patient was never admitted.  Assessment & Plan:   Tanor was seen today for annual exam.  Diagnoses and all orders for this visit:  Well adult exam -     CBC with Differential/Platelet -     CMP14+EGFR -     Lipid panel  Essential hypertension -     CBC with Differential/Platelet -     CMP14+EGFR -     Lipid panel  Mixed hyperlipidemia -     CBC with Differential/Platelet -     CMP14+EGFR -  Lipid panel  Other orders -     HYDROcodone-acetaminophen (NORCO) 5-325 MG tablet; Take 1 tablet by mouth every 6 (six) hours as needed for moderate pain. -     HYDROcodone-acetaminophen (NORCO) 5-325 MG tablet; Take 1 tablet by mouth every 6 (six) hours as needed for moderate pain. -     HYDROcodone-acetaminophen (NORCO) 5-325 MG tablet; Take 1 tablet by mouth every 6 (six) hours as needed for moderate pain.    I have discontinued Mr. Calandro's cyclobenzaprine. I am also having him start on HYDROcodone-acetaminophen and  HYDROcodone-acetaminophen. Additionally, I am having him maintain his allopurinol, valsartan-hydrochlorothiazide, clonazePAM, diltiazem, and HYDROcodone-acetaminophen.  Allergies as of 01/02/2017      Reactions   Prednisone    Rage, allergic reaction      Medication List       Accurate as of 01/02/17  9:07 AM. Always use your most recent med list.          allopurinol 100 MG tablet Commonly known as:  ZYLOPRIM Take 1 tablet (100 mg total) by mouth daily.   clonazePAM 1 MG tablet Commonly known as:  KLONOPIN TAKE 1 AND 1/2 TABLET AT BEDTIME   diltiazem 360 MG 24 hr capsule Commonly known as:  CARDIZEM CD TAKE 1 CAPSULE (360 MG TOTAL) BY MOUTH DAILY.   HYDROcodone-acetaminophen 5-325 MG tablet Commonly known as:  NORCO Take 1 tablet by mouth every 6 (six) hours as needed for moderate pain.   HYDROcodone-acetaminophen 5-325 MG tablet Commonly known as:  NORCO Take 1 tablet by mouth every 6 (six) hours as needed for moderate pain.   HYDROcodone-acetaminophen 5-325 MG tablet Commonly known as:  NORCO Take 1 tablet by mouth every 6 (six) hours as needed for moderate pain.   valsartan-hydrochlorothiazide 320-12.5 MG tablet Commonly known as:  DIOVAN-HCT Take 1 tablet by mouth every morning.        Follow-up: Return in about 3 months (around 04/01/2017).  Claretta Fraise, M.D.

## 2017-01-03 LAB — CMP14+EGFR
A/G RATIO: 2 (ref 1.2–2.2)
ALT: 38 IU/L (ref 0–44)
AST: 24 IU/L (ref 0–40)
Albumin: 5 g/dL (ref 3.5–5.5)
Alkaline Phosphatase: 84 IU/L (ref 39–117)
BUN/Creatinine Ratio: 17 (ref 9–20)
BUN: 15 mg/dL (ref 6–20)
Bilirubin Total: 0.6 mg/dL (ref 0.0–1.2)
CO2: 22 mmol/L (ref 18–29)
CREATININE: 0.9 mg/dL (ref 0.76–1.27)
Calcium: 9.9 mg/dL (ref 8.7–10.2)
Chloride: 97 mmol/L (ref 96–106)
GFR calc Af Amer: 125 mL/min/{1.73_m2} (ref >59)
GFR, EST NON AFRICAN AMERICAN: 108 mL/min/{1.73_m2} (ref >59)
GLUCOSE: 99 mg/dL (ref 65–99)
Globulin, Total: 2.5 g/dL (ref 1.5–4.5)
POTASSIUM: 4.3 mmol/L (ref 3.5–5.2)
Sodium: 140 mmol/L (ref 134–144)
Total Protein: 7.5 g/dL (ref 6.0–8.5)

## 2017-01-03 LAB — CBC WITH DIFFERENTIAL/PLATELET
BASOS ABS: 0 10*3/uL (ref 0.0–0.2)
BASOS: 1 %
EOS (ABSOLUTE): 0.1 10*3/uL (ref 0.0–0.4)
Eos: 1 %
Hematocrit: 47.3 % (ref 37.5–51.0)
Hemoglobin: 16.2 g/dL (ref 13.0–17.7)
IMMATURE GRANS (ABS): 0 10*3/uL (ref 0.0–0.1)
IMMATURE GRANULOCYTES: 0 %
LYMPHS: 35 %
Lymphocytes Absolute: 2.5 10*3/uL (ref 0.7–3.1)
MCH: 31.4 pg (ref 26.6–33.0)
MCHC: 34.2 g/dL (ref 31.5–35.7)
MCV: 92 fL (ref 79–97)
MONOS ABS: 0.6 10*3/uL (ref 0.1–0.9)
Monocytes: 8 %
NEUTROS PCT: 55 %
Neutrophils Absolute: 3.9 10*3/uL (ref 1.4–7.0)
Platelets: 265 10*3/uL (ref 150–379)
RBC: 5.16 x10E6/uL (ref 4.14–5.80)
RDW: 13.6 % (ref 12.3–15.4)
WBC: 7.2 10*3/uL (ref 3.4–10.8)

## 2017-01-03 LAB — LIPID PANEL
CHOL/HDL RATIO: 4.5 ratio (ref 0.0–5.0)
Cholesterol, Total: 190 mg/dL (ref 100–199)
HDL: 42 mg/dL (ref >39)
LDL Calculated: 116 mg/dL — ABNORMAL HIGH (ref 0–99)
TRIGLYCERIDES: 159 mg/dL — AB (ref 0–149)
VLDL CHOLESTEROL CAL: 32 mg/dL (ref 5–40)

## 2017-05-17 ENCOUNTER — Other Ambulatory Visit: Payer: Self-pay | Admitting: Family Medicine

## 2017-05-25 ENCOUNTER — Other Ambulatory Visit: Payer: Self-pay | Admitting: Family Medicine

## 2017-05-25 MED ORDER — CLONAZEPAM 2 MG PO TABS: 2.0000 mg | ORAL_TABLET | Freq: Every day | ORAL | 0 refills | Status: DC

## 2017-05-25 NOTE — Telephone Encounter (Signed)
Wife states patients clonazepam can't be filled until 7/12. They are going out of town 7/7-7/15. Wanting to know if Dr. Darlyn ReadStacks can write a new rx for clonazepam so he can get It filled on 7/6 before going out of town. Please advise and send back to the pools.

## 2017-05-25 NOTE — Telephone Encounter (Signed)
Return call to pharmacy.

## 2017-05-25 NOTE — Telephone Encounter (Signed)
Rx called in. Patient aware.  

## 2017-05-25 NOTE — Telephone Encounter (Signed)
In order to make that work, a change of strength has to be made so that insurance will honor an early prescription. Because of that I increased him from 1-1/2 mg at bedtime to 2 mg at bedtime for 1 week only. Please call that prescription to his drugstore. He should find in my orders. Then let the patient know and tell him to have a great trip.  Thanks, Fluor CorporationWarren Socorro Ebron

## 2017-06-08 ENCOUNTER — Other Ambulatory Visit: Payer: Self-pay | Admitting: Family Medicine

## 2017-06-10 NOTE — Telephone Encounter (Signed)
Authorize 30 days only. Then contact the patient letting them know that they will need an appointment before any further prescriptions can be sent in. 

## 2017-06-10 NOTE — Telephone Encounter (Signed)
Last seen 01/02/17  Dr Darlyn ReadStacks   If approved route to nurse to call into  [phone]

## 2017-06-10 NOTE — Telephone Encounter (Signed)
Rx called to pharmacy

## 2017-07-17 ENCOUNTER — Encounter: Payer: Self-pay | Admitting: Family Medicine

## 2017-07-17 ENCOUNTER — Ambulatory Visit (INDEPENDENT_AMBULATORY_CARE_PROVIDER_SITE_OTHER): Payer: BLUE CROSS/BLUE SHIELD | Admitting: Family Medicine

## 2017-07-17 VITALS — BP 124/74 | HR 83 | Temp 97.1°F | Ht 71.0 in | Wt 222.0 lb

## 2017-07-17 DIAGNOSIS — I10 Essential (primary) hypertension: Secondary | ICD-10-CM | POA: Diagnosis not present

## 2017-07-17 DIAGNOSIS — M5442 Lumbago with sciatica, left side: Secondary | ICD-10-CM

## 2017-07-17 DIAGNOSIS — G8929 Other chronic pain: Secondary | ICD-10-CM

## 2017-07-17 DIAGNOSIS — M5126 Other intervertebral disc displacement, lumbar region: Secondary | ICD-10-CM

## 2017-07-17 MED ORDER — DILTIAZEM HCL ER COATED BEADS 360 MG PO CP24: ORAL_CAPSULE | ORAL | 0 refills | Status: DC

## 2017-07-17 MED ORDER — HYDROCODONE-ACETAMINOPHEN 5-325 MG PO TABS: 1.0000 | ORAL_TABLET | Freq: Four times a day (QID) | ORAL | 0 refills | Status: DC | PRN

## 2017-07-17 MED ORDER — ALLOPURINOL 100 MG PO TABS: 100.0000 mg | ORAL_TABLET | Freq: Every day | ORAL | 0 refills | Status: DC

## 2017-07-17 MED ORDER — CLONAZEPAM 1 MG PO TABS: ORAL_TABLET | ORAL | 3 refills | Status: DC

## 2017-07-17 MED ORDER — VALSARTAN-HYDROCHLOROTHIAZIDE 320-12.5 MG PO TABS
1.0000 | ORAL_TABLET | Freq: Every morning | ORAL | 0 refills | Status: DC
Start: 2017-07-17 — End: 2017-12-09

## 2017-07-17 NOTE — Progress Notes (Signed)
Subjective:  Patient ID: Philip Shepard, male    DOB: [birth year]  Age: 41 y.o. MRN: [id number]  CC: Hypertension (pt here today for routine follow up of his chronic medical conditions and he is still waiting to get into a back doctor)   HPI [name] presents for Continued back pain. He's been able to stretch his hydrocodone to last almost 6 months instead of 3. He is continuing to have low back pain with sciatica. 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 Unfortunately this creates increased back strain. However, he is working hard to avoid increasing his dependence on opiates. He does continue to take 1-1/2 mg of Klonopin every night to help with sleep and to help him relax and avoid his problems with anxiety. He denies any signs of depression C pH Q below.   follow-up of hypertension. Patient has no history of headache chest pain or shortness of breath or recent cough. Patient also denies symptoms of TIA such as numbness weakness lateralizing. Patient checks  blood pressure at home and has not had any elevated readings recently. Patient denies side effects from his medication. States taking it regularly. So far he's been able to get the valsartan and doesn't want to make the switch to Benicar unless she has to. He'll notify me if the pharmacist let him know the valsartan is no longer available.  Depression screen [facility] 2/9 07/17/2017 01/02/2017 12/05/2016  Decreased Interest 0 0 0  Down, Depressed, Hopeless 0 0 0  PHQ - 2 Score 0 0 0  Altered sleeping - - -  Tired, decreased energy - - -  Change in appetite - - -  Feeling bad or failure about yourself  - - -  Trouble concentrating - - -  Moving slowly or fidgety/restless - - -  Suicidal thoughts - - -  PHQ-9 Score - - -    History Elgie has a past medical history of Anxiety; Hypertension; and Sleep disorder.   He has a past surgical history that includes Knee surgery (Left); carpel tunnel left and  right; Kidney stone surgery; and Tonsillectomy and adenoidectomy.   His family history includes Arthritis in his father and mother; Cancer in his mother and paternal grandmother; Diabetes in his mother.He reports that he has been smoking Cigarettes.  He quit smokeless tobacco use about 10 months ago. He reports that he does not drink alcohol or use drugs.    ROS Review of Systems  Constitutional: Positive for activity change (he is trying to cut back on activities other than work in order to preserve his back). Negative for chills, diaphoresis and fever.  HENT: Negative for rhinorrhea and sore throat.   Respiratory: Negative for cough and shortness of breath.   Cardiovascular: Negative for chest pain.  Gastrointestinal: Negative for abdominal pain.  Musculoskeletal: Positive for back pain and myalgias. Negative for arthralgias.  Skin: Negative for rash.  Neurological: Negative for weakness and headaches.  Psychiatric/Behavioral: The patient is nervous/anxious.     Objective:  BP 124/74   Pulse 83   Temp (!) 97.1 F (36.2 C) (Oral)   Ht 5\' 11"  (1.803 m)   Wt 222 lb (100.7 kg)   BMI 30.96 kg/m   BP Readings from Last 3 Encounters:  07/17/17 124/74  01/02/17 103/69  12/05/16 125/73    Wt Readings from Last 3 Encounters:  07/17/17 222 lb (100.7 kg)  01/02/17 222 lb (100.7 kg)  12/05/16 221  lb (100.2 kg)     Physical Exam  Constitutional: He is oriented to person, place, and time. He appears well-developed and well-nourished. No distress.  HENT:  Head: Normocephalic and atraumatic.  Right Ear: External ear normal.  Left Ear: External ear normal.  Nose: Nose normal.  Mouth/Throat: Oropharynx is clear and moist.  Eyes: Pupils are equal, round, and reactive to light. Conjunctivae and EOM are normal.  Neck: Normal range of motion. Neck supple. No thyromegaly present.  Cardiovascular: Normal rate, regular rhythm and normal heart sounds.   No murmur heard. Pulmonary/Chest:  Effort normal and breath sounds normal. No respiratory distress. He has no wheezes. He has no rales.  Abdominal: Soft. Bowel sounds are normal. He exhibits no distension. There is no tenderness.  Musculoskeletal: He exhibits tenderness (at midline into the left at the lower lumbar region.). He exhibits no edema.  Lymphadenopathy:    He has no cervical adenopathy.  Neurological: He is alert and oriented to person, place, and time. He has normal reflexes.  Skin: Skin is warm and dry.  Psychiatric: He has a normal mood and affect. His behavior is normal. Judgment and thought content normal.      Assessment & Plan:   Juanita was seen today for hypertension.  Diagnoses and all orders for this visit:  Chronic left-sided low back pain with left-sided sciatica  HNP (herniated nucleus pulposus), lumbar -     Ambulatory referral to Neurosurgery  Essential hypertension  Other orders -     allopurinol (ZYLOPRIM) 100 MG tablet; Take 1 tablet (100 mg total) by mouth daily. -     clonazePAM (KLONOPIN) 1 MG tablet; TAKE 1 & 1/2 TABLETS AT BEDTIME -     diltiazem (CARDIZEM CD) 360 MG 24 hr capsule; TAKE 1 CAPSULE (360 MG TOTAL) BY MOUTH DAILY. -     HYDROcodone-acetaminophen (NORCO) 5-325 MG tablet; Take 1 tablet by mouth every 6 (six) hours as needed for moderate pain. -     HYDROcodone-acetaminophen (NORCO) 5-325 MG tablet; Take 1 tablet by mouth every 6 (six) hours as needed for moderate pain. -     HYDROcodone-acetaminophen (NORCO) 5-325 MG tablet; Take 1 tablet by mouth every 6 (six) hours as needed for moderate pain. -     valsartan-hydrochlorothiazide (DIOVAN-HCT) 320-12.5 MG tablet; Take 1 tablet by mouth every morning.       I am having Mr. Warwick maintain his allopurinol, clonazePAM, diltiazem, HYDROcodone-acetaminophen, HYDROcodone-acetaminophen, HYDROcodone-acetaminophen, and valsartan-hydrochlorothiazide.  Allergies as of 07/17/2017      Reactions   Penicillins    Prednisone     Rage, allergic reaction      Medication List       Accurate as of 07/17/17 11:59 PM. Always use your most recent med list.          allopurinol 100 MG tablet Commonly known as:  ZYLOPRIM Take 1 tablet (100 mg total) by mouth daily.   clonazePAM 1 MG tablet Commonly known as:  KLONOPIN TAKE 1 & 1/2 TABLETS AT BEDTIME   diltiazem 360 MG 24 hr capsule Commonly known as:  CARDIZEM CD TAKE 1 CAPSULE (360 MG TOTAL) BY MOUTH DAILY.   HYDROcodone-acetaminophen 5-325 MG tablet Commonly known as:  NORCO Take 1 tablet by mouth every 6 (six) hours as needed for moderate pain.   HYDROcodone-acetaminophen 5-325 MG tablet Commonly known as:  NORCO Take 1 tablet by mouth every 6 (six) hours as needed for moderate pain.   HYDROcodone-acetaminophen 5-325 MG tablet Commonly known  as:  NORCO Take 1 tablet by mouth every 6 (six) hours as needed for moderate pain.   valsartan-hydrochlorothiazide 320-12.5 MG tablet Commonly known as:  DIOVAN-HCT Take 1 tablet by mouth every morning.            Discharge Care Instructions        Start     Ordered   07/17/17 0000  Ambulatory referral to Neurosurgery    Comments:  Neuro at [facility] or [facility]ane in [place]   07/17/17 1426   07/17/17 0000  allopurinol (ZYLOPRIM) 100 MG tablet  Daily     07/17/17 1426   07/17/17 0000  clonazePAM (KLONOPIN) 1 MG tablet    Comments:  This request is for a new prescription for a controlled substance as required by Federal/State law.Marland Kitchen   07/17/17 1426   07/17/17 0000  diltiazem (CARDIZEM CD) 360 MG 24 hr capsule     07/17/17 1426   07/17/17 0000  HYDROcodone-acetaminophen (NORCO) 5-325 MG tablet  Every 6 hours PRN     07/17/17 1426   07/17/17 0000  HYDROcodone-acetaminophen (NORCO) 5-325 MG tablet  Every 6 hours PRN    Comments:  Do not fill until 30 days after prescription written.   07/17/17 1426   07/17/17 0000  HYDROcodone-acetaminophen (NORCO) 5-325 MG tablet  Every 6 hours PRN      Comments:  Do not fill until 60 days after prescription written.   07/17/17 1426   07/17/17 0000  valsartan-hydrochlorothiazide (DIOVAN-HCT) 320-12.5 MG tablet   Every morning - 10a     07/17/17 1426       Follow-up: No Follow-up on file.  Mechele Claude, M.D.

## 2017-11-17 ENCOUNTER — Other Ambulatory Visit: Payer: Self-pay | Admitting: Family Medicine

## 2017-11-18 NOTE — Telephone Encounter (Signed)
Last seen 8/24/178  Dr Darlyn ReadStacks  If approved route to nurse to call into [facility] [place]  [phone]

## 2017-11-19 ENCOUNTER — Telehealth: Payer: Self-pay

## 2017-11-19 NOTE — Telephone Encounter (Signed)
Wife is requesting refill of clonazepam 1mg . Patient was last seen 8/24 and was filled #45 r-3. Has apt with [facility] for med refill 1/11 @ 3:55. Wife states that he takes it each night in order to do his cpap. Covering PCP- please advise

## 2017-11-19 NOTE — Telephone Encounter (Signed)
Stacks patient, last seen 8/24

## 2017-11-20 NOTE — Telephone Encounter (Signed)
Pt aware rx sent over and pt has appt scheduled 12/04/17.

## 2017-12-04 ENCOUNTER — Ambulatory Visit: Payer: Self-pay | Admitting: Family Medicine

## 2017-12-09 ENCOUNTER — Other Ambulatory Visit: Payer: Self-pay | Admitting: *Deleted

## 2017-12-09 MED ORDER — ALLOPURINOL 100 MG PO TABS: 100.0000 mg | ORAL_TABLET | Freq: Every day | ORAL | 0 refills | Status: DC

## 2017-12-09 MED ORDER — VALSARTAN-HYDROCHLOROTHIAZIDE 320-12.5 MG PO TABS: 1.0000 | ORAL_TABLET | Freq: Every morning | ORAL | 0 refills | Status: DC

## 2017-12-09 NOTE — Telephone Encounter (Signed)
Next OV 12/18/17

## 2017-12-18 ENCOUNTER — Encounter: Payer: Self-pay | Admitting: Family Medicine

## 2017-12-18 ENCOUNTER — Ambulatory Visit (INDEPENDENT_AMBULATORY_CARE_PROVIDER_SITE_OTHER): Payer: BLUE CROSS/BLUE SHIELD | Admitting: Family Medicine

## 2017-12-18 VITALS — BP 124/70 | HR 88 | Temp 97.8°F | Ht 71.0 in | Wt 216.0 lb

## 2017-12-18 DIAGNOSIS — I10 Essential (primary) hypertension: Secondary | ICD-10-CM | POA: Diagnosis not present

## 2017-12-18 DIAGNOSIS — K6289 Other specified diseases of anus and rectum: Secondary | ICD-10-CM

## 2017-12-18 DIAGNOSIS — M109 Gout, unspecified: Secondary | ICD-10-CM

## 2017-12-18 MED ORDER — CLONAZEPAM 1 MG PO TABS: ORAL_TABLET | ORAL | 2 refills | Status: DC

## 2017-12-18 MED ORDER — VALSARTAN-HYDROCHLOROTHIAZIDE 320-12.5 MG PO TABS: 1.0000 | ORAL_TABLET | Freq: Every morning | ORAL | 1 refills | Status: DC

## 2017-12-18 MED ORDER — HYDROCODONE-ACETAMINOPHEN 5-325 MG PO TABS: 1.0000 | ORAL_TABLET | Freq: Four times a day (QID) | ORAL | 0 refills | Status: DC | PRN

## 2017-12-18 MED ORDER — ALLOPURINOL 100 MG PO TABS: 100.0000 mg | ORAL_TABLET | Freq: Every day | ORAL | 1 refills | Status: DC

## 2017-12-18 MED ORDER — DILTIAZEM HCL ER COATED BEADS 360 MG PO CP24: ORAL_CAPSULE | ORAL | 1 refills | Status: DC

## 2017-12-18 MED ORDER — CIPROFLOXACIN HCL 500 MG PO TABS: 500.0000 mg | ORAL_TABLET | Freq: Two times a day (BID) | ORAL | 0 refills | Status: DC

## 2017-12-18 NOTE — Progress Notes (Signed)
Subjective:  Patient ID: Philip Shepard, male    DOB: [birth year]  Age: 42 y.o. MRN: [id number]  CC: Cyst (pt here today c/o "cyst" at the bottom of his "crack" and needs med refills)   HPI Philip Shepard presents for follow-up of his chronic pain.  He is planning to see a chiropractor tomorrow.  He is considering surgical options.  He says that the pain medicine does not hold him.  He does not want to increase them due to risk for habituation.  He says that most days he does not take 1 at all he just puts up with 5/10 pain however he is on mandatory overtime at work and has been for many months.  Some days when work is extremely physical he will have to take 1-1/2 tablets just to get some rest at night.  He does not have insomnia per se he just hurt so bad he cannot sleep.  Patient also reports that he has a new nodule in the perineum.  He says that this was much bigger yesterday and is gone down some.  He has a history of hidradenitis of the groin and inner thighs in the past who says this does not hurt like that used to.  However he is concerned for recurrence.   Follow-up of hypertension. Patient has no history of headache chest pain or shortness of breath or recent cough. Patient also denies symptoms of TIA such as numbness weakness lateralizing. Patient checks  blood pressure at home and has not had any elevated readings recently. Patient denies side effects from his medication. States taking it regularly.   Depression screen [facility] 2/9 12/18/2017 07/17/2017 01/02/2017  Decreased Interest 0 0 0  Down, Depressed, Hopeless 0 0 0  PHQ - 2 Score 0 0 0  Altered sleeping - - -  Tired, decreased energy - - -  Change in appetite - - -  Feeling bad or failure about yourself  - - -  Trouble concentrating - - -  Moving slowly or fidgety/restless - - -  Suicidal thoughts - - -  PHQ-9 Score - - -    History Simeon has a past medical history of Anxiety, Hypertension, and Sleep disorder.   He has a past  surgical history that includes Knee surgery (Left); carpel tunnel left and right; Kidney stone surgery; and Tonsillectomy and adenoidectomy.   His family history includes Arthritis in his father and mother; Cancer in his mother and paternal grandmother; Diabetes in his mother.He reports that he has been smoking cigarettes.  He quit smokeless tobacco use about 15 months ago. He reports that he does not drink alcohol or use drugs.    ROS Review of Systems  Constitutional: Negative for chills, diaphoresis, fever and unexpected weight change.  HENT: Negative for congestion, hearing loss, rhinorrhea and sore throat.   Eyes: Negative for visual disturbance.  Respiratory: Negative for cough and shortness of breath.   Cardiovascular: Negative for chest pain.  Gastrointestinal: Negative for abdominal pain, constipation and diarrhea.  Genitourinary: Negative for dysuria and flank pain.  Musculoskeletal: Positive for arthralgias and back pain. Negative for joint swelling.  Skin: Negative for rash.  Neurological: Negative for dizziness and headaches.  Psychiatric/Behavioral: Negative for dysphoric mood and sleep disturbance.    Objective:  BP 124/70   Pulse 88   Temp 97.8 F (36.6 C) (Oral)   Ht 5' 11"  (1.803 m)   Wt 216 lb (98 kg)   BMI 30.13 kg/m   BP  Readings from Last 3 Encounters:  12/18/17 124/70  07/17/17 124/74  01/02/17 103/69    Wt Readings from Last 3 Encounters:  12/18/17 216 lb (98 kg)  07/17/17 222 lb (100.7 kg)  01/02/17 222 lb (100.7 kg)     Physical Exam  Constitutional: He is oriented to person, place, and time. He appears well-developed and well-nourished. No distress.  HENT:  Head: Normocephalic and atraumatic.  Right Ear: External ear normal.  Left Ear: External ear normal.  Nose: Nose normal.  Mouth/Throat: Oropharynx is clear and moist.  Eyes: Conjunctivae and EOM are normal. Pupils are equal, round, and reactive to light.  Neck: Normal range of motion.  Neck supple. No thyromegaly present.  Cardiovascular: Normal rate, regular rhythm and normal heart sounds.  No murmur heard. Pulmonary/Chest: Effort normal and breath sounds normal. No respiratory distress. He has no wheezes. He has no rales.  Abdominal: Soft. Bowel sounds are normal. He exhibits no distension. There is no tenderness.  Genitourinary: Testes normal. Cremasteric reflex is present.  Genitourinary Comments: There is a rounded rubbery nodule subcutaneously.  It does not come to the surface.  It is not palpable through the rectum but it is located approximately 2 cm from the anal verge.  It is deep but palpable.  It is freely movable.  It is nontender.  It is not erythematous or edematous.  Lymphadenopathy:    He has no cervical adenopathy.  Neurological: He is alert and oriented to person, place, and time. He has normal reflexes.  Skin: Skin is warm and dry.  Psychiatric: Thought content normal. His mood appears anxious. His speech is rapid and/or pressured. He is agitated. Cognition and memory are normal. He expresses impulsivity.      Assessment & Plan:   Coulter was seen today for cyst.  Diagnoses and all orders for this visit:  Essential hypertension -     CMP14+EGFR  Gout, unspecified cause, unspecified chronicity, unspecified site -     Uric acid  Perianal cyst  Other orders -     Cancel: HYDROcodone-acetaminophen (NORCO) 5-325 MG tablet; Take 1 tablet by mouth every 6 (six) hours as needed for moderate pain. -     Cancel: HYDROcodone-acetaminophen (NORCO) 5-325 MG tablet; Take 1 tablet by mouth every 6 (six) hours as needed for moderate pain. -     Cancel: HYDROcodone-acetaminophen (NORCO) 5-325 MG tablet; Take 1 tablet by mouth every 6 (six) hours as needed for moderate pain. -     allopurinol (ZYLOPRIM) 100 MG tablet; Take 1 tablet (100 mg total) by mouth daily. -     diltiazem (CARDIZEM CD) 360 MG 24 hr capsule; TAKE 1 CAPSULE (360 MG TOTAL) BY MOUTH DAILY. -      valsartan-hydrochlorothiazide (DIOVAN-HCT) 320-12.5 MG tablet; Take 1 tablet by mouth every morning. -     ciprofloxacin (CIPRO) 500 MG tablet; Take 1 tablet (500 mg total) by mouth 2 (two) times daily. -     clonazePAM (KLONOPIN) 1 MG tablet; TAKE 1 AND 1/2 TABLETS AT BEDTIME -     HYDROcodone-acetaminophen (NORCO) 5-325 MG tablet; Take 1 tablet by mouth every 6 (six) hours as needed for moderate pain. -     HYDROcodone-acetaminophen (NORCO) 5-325 MG tablet; Take 1 tablet by mouth every 6 (six) hours as needed for moderate pain. -     HYDROcodone-acetaminophen (NORCO) 5-325 MG tablet; Take 1 tablet by mouth every 6 (six) hours as needed for moderate pain.       I  am having Philip Shepard start on ciprofloxacin. I am also having him maintain his allopurinol, diltiazem, valsartan-hydrochlorothiazide, clonazePAM, HYDROcodone-acetaminophen, HYDROcodone-acetaminophen, and HYDROcodone-acetaminophen.  Allergies as of 12/18/2017      Reactions   Penicillins    Prednisone    Rage, allergic reaction      Medication List        Accurate as of 12/18/17  6:37 PM. Always use your most recent med list.          allopurinol 100 MG tablet Commonly known as:  ZYLOPRIM Take 1 tablet (100 mg total) by mouth daily.   ciprofloxacin 500 MG tablet Commonly known as:  CIPRO Take 1 tablet (500 mg total) by mouth 2 (two) times daily.   clonazePAM 1 MG tablet Commonly known as:  KLONOPIN TAKE 1 AND 1/2 TABLETS AT BEDTIME   diltiazem 360 MG 24 hr capsule Commonly known as:  CARDIZEM CD TAKE 1 CAPSULE (360 MG TOTAL) BY MOUTH DAILY.   HYDROcodone-acetaminophen 5-325 MG tablet Commonly known as:  NORCO Take 1 tablet by mouth every 6 (six) hours as needed for moderate pain.   HYDROcodone-acetaminophen 5-325 MG tablet Commonly known as:  NORCO Take 1 tablet by mouth every 6 (six) hours as needed for moderate pain.   HYDROcodone-acetaminophen 5-325 MG tablet Commonly known as:  NORCO Take 1 tablet  by mouth every 6 (six) hours as needed for moderate pain.   valsartan-hydrochlorothiazide 320-12.5 MG tablet Commonly known as:  DIOVAN-HCT Take 1 tablet by mouth every morning.       For the cyst he should keep the area clean and dry monitor for worsening such as more pain redness swelling drainage.  Notify the office if that occurs. Follow-up: Return in about 3 months (around 03/18/2018).  Claretta Fraise, M.D.

## 2017-12-18 NOTE — Patient Instructions (Signed)

## 2017-12-19 LAB — CMP14+EGFR
A/G RATIO: 2.3 — AB (ref 1.2–2.2)
ALBUMIN: 5.2 g/dL (ref 3.5–5.5)
ALK PHOS: 77 IU/L (ref 39–117)
ALT: 33 IU/L (ref 0–44)
AST: 21 IU/L (ref 0–40)
BILIRUBIN TOTAL: 0.4 mg/dL (ref 0.0–1.2)
BUN / CREAT RATIO: 13 (ref 9–20)
BUN: 12 mg/dL (ref 6–20)
CHLORIDE: 99 mmol/L (ref 96–106)
CO2: 25 mmol/L (ref 20–29)
Calcium: 10 mg/dL (ref 8.7–10.2)
Creatinine, Ser: 0.93 mg/dL (ref 0.76–1.27)
GFR calc Af Amer: 119 mL/min/{1.73_m2} (ref >59)
GFR calc non Af Amer: 103 mL/min/{1.73_m2} (ref >59)
GLUCOSE: 74 mg/dL (ref 65–99)
Globulin, Total: 2.3 g/dL (ref 1.5–4.5)
POTASSIUM: 4.1 mmol/L (ref 3.5–5.2)
SODIUM: 140 mmol/L (ref 134–144)
Total Protein: 7.5 g/dL (ref 6.0–8.5)

## 2017-12-19 LAB — URIC ACID: Uric Acid: 6.9 mg/dL (ref 3.7–8.6)

## 2017-12-21 ENCOUNTER — Ambulatory Visit: Payer: BLUE CROSS/BLUE SHIELD | Admitting: Pediatrics

## 2017-12-24 ENCOUNTER — Encounter: Payer: Self-pay | Admitting: Family Medicine

## 2018-01-22 ENCOUNTER — Ambulatory Visit: Payer: BLUE CROSS/BLUE SHIELD | Admitting: Family Medicine

## 2018-03-19 ENCOUNTER — Encounter: Payer: Self-pay | Admitting: Family Medicine

## 2018-03-19 ENCOUNTER — Ambulatory Visit: Payer: BLUE CROSS/BLUE SHIELD | Admitting: Family Medicine

## 2018-03-19 ENCOUNTER — Other Ambulatory Visit: Payer: Self-pay | Admitting: Family Medicine

## 2018-03-19 ENCOUNTER — Telehealth: Payer: Self-pay | Admitting: Family Medicine

## 2018-03-19 VITALS — BP 123/79 | HR 73 | Ht 71.0 in | Wt 222.2 lb

## 2018-03-19 DIAGNOSIS — Z0000 Encounter for general adult medical examination without abnormal findings: Secondary | ICD-10-CM

## 2018-03-19 DIAGNOSIS — M5126 Other intervertebral disc displacement, lumbar region: Secondary | ICD-10-CM | POA: Diagnosis not present

## 2018-03-19 DIAGNOSIS — F411 Generalized anxiety disorder: Secondary | ICD-10-CM

## 2018-03-19 DIAGNOSIS — I10 Essential (primary) hypertension: Secondary | ICD-10-CM | POA: Diagnosis not present

## 2018-03-19 DIAGNOSIS — M109 Gout, unspecified: Secondary | ICD-10-CM

## 2018-03-19 MED ORDER — HYDROCODONE-ACETAMINOPHEN 5-325 MG PO TABS: 1.0000 | ORAL_TABLET | Freq: Four times a day (QID) | ORAL | 0 refills | Status: AC | PRN

## 2018-03-19 MED ORDER — HYDROCODONE-ACETAMINOPHEN 5-325 MG PO TABS
1.0000 | ORAL_TABLET | Freq: Four times a day (QID) | ORAL | 0 refills | Status: AC | PRN
Start: 2018-05-18 — End: 2018-06-17

## 2018-03-19 MED ORDER — CLONAZEPAM 1 MG PO TABS: ORAL_TABLET | ORAL | 2 refills | Status: DC

## 2018-03-19 NOTE — Telephone Encounter (Signed)
Seen today Dr Stacks 

## 2018-03-19 NOTE — Telephone Encounter (Signed)
Pt aware rx's have been sent over.

## 2018-03-19 NOTE — Progress Notes (Signed)
Subjective:  Patient ID: Philip Shepard, male    DOB: February 23, 1976  Age: 42 y.o. MRN: [id number]  CC: Annual Exam (pt here today for CPE)   HPI Philip Shepard presents for Annual exam and pain management exam. Taking hydrocodone for low back pain. Pain baseline is 7/10. Pt. Able to manage at this level. However pain will go to 10+ and he will take hydrocone. Occurring about 5 times a week or more. Exertion or riding in a car for extended times causes flare ups.   GAD 7 : Generalized Anxiety Score 03/19/2018 02/22/2016  Nervous, Anxious, on Edge 0 3  Control/stop worrying 0 3  Worry too much - different things 0 3  Trouble relaxing 0 3  Restless 0 3  Easily annoyed or irritable 0 3  Afraid - awful might happen 0 0  Total GAD 7 Score 0 18  Anxiety Difficulty - Somewhat difficult      Depression screen [facility] 2/9 03/19/2018 12/18/2017 07/17/2017  Decreased Interest 0 0 0  Down, Depressed, Hopeless 0 0 0  PHQ - 2 Score 0 0 0  Altered sleeping - - -  Tired, decreased energy - - -  Change in appetite - - -  Feeling bad or failure about yourself  - - -  Trouble concentrating - - -  Moving slowly or fidgety/restless - - -  Suicidal thoughts - - -  PHQ-9 Score - - -    History Philip Shepard has a past medical history of Anxiety, Hypertension, and Sleep disorder.   He has a past surgical history that includes Knee surgery (Left); carpel tunnel left and right; Kidney stone surgery; and Tonsillectomy and adenoidectomy.   His family history includes Arthritis in his father and mother; Cancer in his mother and paternal grandmother; Diabetes in his mother.He reports that he has been smoking cigarettes.  He quit smokeless tobacco use about 18 months ago. He reports that he does not drink alcohol or use drugs.    ROS Review of Systems  Constitutional: Negative for activity change, appetite change, chills, diaphoresis, fatigue, fever and unexpected weight change.  HENT: Negative for congestion, ear pain,  hearing loss, postnasal drip, rhinorrhea, sore throat, tinnitus and trouble swallowing.   Eyes: Negative for photophobia, pain, discharge and redness.  Respiratory: Negative for apnea, cough, choking, chest tightness, shortness of breath, wheezing and stridor.   Cardiovascular: Negative for chest pain, palpitations and leg swelling.  Gastrointestinal: Negative for abdominal distention, abdominal pain, blood in stool, constipation, diarrhea, nausea and vomiting.  Endocrine: Negative for cold intolerance, heat intolerance, polydipsia, polyphagia and polyuria.  Genitourinary: Negative for difficulty urinating, dysuria, enuresis, flank pain, frequency, genital sores, hematuria and urgency.  Musculoskeletal: Positive for arthralgias, back pain and myalgias. Negative for joint swelling.  Skin: Negative for color change, rash and wound.  Allergic/Immunologic: Negative for immunocompromised state.  Neurological: Negative for dizziness, tremors, seizures, syncope, facial asymmetry, speech difficulty, weakness, light-headedness, numbness and headaches.  Hematological: Does not bruise/bleed easily.  Psychiatric/Behavioral: Negative for agitation, behavioral problems, confusion, decreased concentration, dysphoric mood, hallucinations, sleep disturbance and suicidal ideas. The patient is not nervous/anxious and is not hyperactive.     Objective:  BP 123/79   Pulse 73   Ht 5' 11"  (1.803 m)   Wt 222 lb 4 oz (100.8 kg)   BMI 31.00 kg/m   BP Readings from Last 3 Encounters:  03/19/18 123/79  12/18/17 124/70  07/17/17 124/74    Wt Readings from Last 3 Encounters:  03/19/18  222 lb 4 oz (100.8 kg)  12/18/17 216 lb (98 kg)  07/17/17 222 lb (100.7 kg)     Physical Exam  Constitutional: He is oriented to person, place, and time. No distress.  HENT:  Head: Normocephalic and atraumatic.  Right Ear: External ear normal.  Left Ear: External ear normal.  Nose: Nose normal.  Mouth/Throat: Oropharynx is  clear and moist.  Eyes: Pupils are equal, round, and reactive to light. Conjunctivae and EOM are normal. No scleral icterus.  Neck: Normal range of motion. Neck supple. No JVD present. No tracheal deviation present. No thyromegaly present.  Cardiovascular: Normal rate, regular rhythm and normal heart sounds. Exam reveals no gallop and no friction rub.  No murmur heard. Pulmonary/Chest: Effort normal and breath sounds normal. No respiratory distress. He has no wheezes. He has no rales. He exhibits no tenderness.  Abdominal: Soft. Bowel sounds are normal. He exhibits no distension and no mass. There is no tenderness. There is no rebound and no guarding.  Musculoskeletal: Normal range of motion. He exhibits no edema. Tenderness: midline lower back.  Lymphadenopathy:    He has no cervical adenopathy.  Neurological: He is alert and oriented to person, place, and time. He displays normal reflexes. No cranial nerve deficit. He exhibits normal muscle tone. Coordination normal.  Skin: Skin is warm and dry. No rash noted. He is not diaphoretic. No erythema.  Psychiatric: Judgment normal.  Vitals reviewed.     Assessment & Plan:   Philip Shepard was seen today for annual exam.  Diagnoses and all orders for this visit:  Well adult exam  Essential hypertension -     CBC with Differential/Platelet -     CMP14+EGFR -     Urinalysis  HNP (herniated nucleus pulposus), lumbar -     ToxASSURE Select 13 (MW), Urine  Generalized anxiety disorder  Gout, unspecified cause, unspecified chronicity, unspecified site -     Uric acid  Other orders -     clonazePAM (KLONOPIN) 1 MG tablet; TAKE 1 AND 1/2 TABLETS AT BEDTIME -     HYDROcodone-acetaminophen (NORCO) 5-325 MG tablet; Take 1 tablet by mouth every 6 (six) hours as needed for moderate pain. -     HYDROcodone-acetaminophen (NORCO) 5-325 MG tablet; Take 1 tablet by mouth every 6 (six) hours as needed for moderate pain. -     HYDROcodone-acetaminophen  (NORCO) 5-325 MG tablet; Take 1 tablet by mouth every 6 (six) hours as needed for moderate pain.       I have discontinued Lasean Kiesel's ciprofloxacin. I am also having him maintain his allopurinol, diltiazem, valsartan-hydrochlorothiazide, clonazePAM, HYDROcodone-acetaminophen, HYDROcodone-acetaminophen, and HYDROcodone-acetaminophen.  Allergies as of 03/19/2018      Reactions   Penicillins    Prednisone    Rage, allergic reaction      Medication List        Accurate as of 03/19/18 10:03 PM. Always use your most recent med list.          allopurinol 100 MG tablet Commonly known as:  ZYLOPRIM Take 1 tablet (100 mg total) by mouth daily.   clonazePAM 1 MG tablet Commonly known as:  KLONOPIN TAKE 1 AND 1/2 TABLETS AT BEDTIME   clonazePAM 1 MG tablet Commonly known as:  KLONOPIN TAKE 1 AND 1/2 TABLETS DAILY BY MOUTH AT BEDTIME   diltiazem 360 MG 24 hr capsule Commonly known as:  CARDIZEM CD TAKE 1 CAPSULE (360 MG TOTAL) BY MOUTH DAILY.   HYDROcodone-acetaminophen 5-325 MG tablet Commonly known  as:  NORCO Take 1 tablet by mouth every 6 (six) hours as needed for moderate pain.   HYDROcodone-acetaminophen 5-325 MG tablet Commonly known as:  NORCO Take 1 tablet by mouth every 6 (six) hours as needed for moderate pain. Start taking on:  04/18/2018   HYDROcodone-acetaminophen 5-325 MG tablet Commonly known as:  NORCO Take 1 tablet by mouth every 6 (six) hours as needed for moderate pain. Start taking on:  05/18/2018   valsartan-hydrochlorothiazide 320-12.5 MG tablet Commonly known as:  DIOVAN-HCT Take 1 tablet by mouth every morning.        Follow-up: Return in about 3 months (around 06/18/2018).  Claretta Fraise, M.D.

## 2018-03-20 LAB — CMP14+EGFR
ALT: 29 IU/L (ref 0–44)
AST: 21 IU/L (ref 0–40)
Albumin/Globulin Ratio: 1.9 (ref 1.2–2.2)
Albumin: 4.6 g/dL (ref 3.5–5.5)
Alkaline Phosphatase: 74 IU/L (ref 39–117)
BILIRUBIN TOTAL: 0.2 mg/dL (ref 0.0–1.2)
BUN / CREAT RATIO: 14 (ref 9–20)
BUN: 13 mg/dL (ref 6–20)
CHLORIDE: 101 mmol/L (ref 96–106)
CO2: 25 mmol/L (ref 20–29)
Calcium: 10 mg/dL (ref 8.7–10.2)
Creatinine, Ser: 0.92 mg/dL (ref 0.76–1.27)
GFR calc non Af Amer: 104 mL/min/{1.73_m2} (ref >59)
GFR, EST AFRICAN AMERICAN: 121 mL/min/{1.73_m2} (ref >59)
GLOBULIN, TOTAL: 2.4 g/dL (ref 1.5–4.5)
Glucose: 106 mg/dL — ABNORMAL HIGH (ref 65–99)
Potassium: 4.3 mmol/L (ref 3.5–5.2)
Sodium: 140 mmol/L (ref 134–144)
TOTAL PROTEIN: 7 g/dL (ref 6.0–8.5)

## 2018-03-20 LAB — CBC WITH DIFFERENTIAL/PLATELET
BASOS ABS: 0 10*3/uL (ref 0.0–0.2)
Basos: 1 %
EOS (ABSOLUTE): 0.2 10*3/uL (ref 0.0–0.4)
EOS: 2 %
HEMATOCRIT: 43.9 % (ref 37.5–51.0)
Hemoglobin: 15.5 g/dL (ref 13.0–17.7)
IMMATURE GRANULOCYTES: 0 %
Immature Grans (Abs): 0 10*3/uL (ref 0.0–0.1)
Lymphocytes Absolute: 2.8 10*3/uL (ref 0.7–3.1)
Lymphs: 40 %
MCH: 31.4 pg (ref 26.6–33.0)
MCHC: 35.3 g/dL (ref 31.5–35.7)
MCV: 89 fL (ref 79–97)
MONOCYTES: 9 %
MONOS ABS: 0.6 10*3/uL (ref 0.1–0.9)
Neutrophils Absolute: 3.4 10*3/uL (ref 1.4–7.0)
Neutrophils: 48 %
PLATELETS: 222 10*3/uL (ref 150–379)
RBC: 4.93 x10E6/uL (ref 4.14–5.80)
RDW: 13.9 % (ref 12.3–15.4)
WBC: 7 10*3/uL (ref 3.4–10.8)

## 2018-03-20 LAB — URIC ACID: Uric Acid: 6.4 mg/dL (ref 3.7–8.6)

## 2018-06-18 ENCOUNTER — Encounter: Payer: Self-pay | Admitting: Family Medicine

## 2018-06-18 ENCOUNTER — Ambulatory Visit (INDEPENDENT_AMBULATORY_CARE_PROVIDER_SITE_OTHER): Payer: BLUE CROSS/BLUE SHIELD | Admitting: Family Medicine

## 2018-06-18 VITALS — BP 124/72 | HR 79 | Temp 98.0°F | Ht 71.0 in | Wt 222.0 lb

## 2018-06-18 DIAGNOSIS — M5126 Other intervertebral disc displacement, lumbar region: Secondary | ICD-10-CM | POA: Diagnosis not present

## 2018-06-18 DIAGNOSIS — I10 Essential (primary) hypertension: Secondary | ICD-10-CM | POA: Diagnosis not present

## 2018-06-18 DIAGNOSIS — M5442 Lumbago with sciatica, left side: Secondary | ICD-10-CM | POA: Diagnosis not present

## 2018-06-18 DIAGNOSIS — G8929 Other chronic pain: Secondary | ICD-10-CM | POA: Diagnosis not present

## 2018-06-18 MED ORDER — CLONAZEPAM 1 MG PO TABS: ORAL_TABLET | ORAL | 2 refills | Status: DC

## 2018-06-18 MED ORDER — ALLOPURINOL 100 MG PO TABS: 100.0000 mg | ORAL_TABLET | Freq: Every day | ORAL | 1 refills | Status: DC

## 2018-06-18 MED ORDER — DILTIAZEM HCL ER COATED BEADS 360 MG PO CP24: ORAL_CAPSULE | ORAL | 1 refills | Status: DC

## 2018-06-18 MED ORDER — HYDROCODONE-ACETAMINOPHEN 5-325 MG PO TABS: 1.0000 | ORAL_TABLET | Freq: Every day | ORAL | 0 refills | Status: DC | PRN

## 2018-06-18 MED ORDER — VALSARTAN-HYDROCHLOROTHIAZIDE 320-12.5 MG PO TABS: 1.0000 | ORAL_TABLET | Freq: Every morning | ORAL | 1 refills | Status: DC

## 2018-06-18 NOTE — Progress Notes (Signed)
Subjective:  Patient ID: Philip Shepard, male    DOB: 1976/06/23  Age: 42 y.o. MRN: [id number]  CC: Medical Management of Chronic Issues   HPI Jamarkis Branam presents for Continued lumbar pain Worse on left with radiation to the left posterior and lateral thigh. Pain as high as 8/10 on awakening. After moving around drops to 3. At work itslowly climbs to 5. Then he takes med in the evening it drops to about a 2. . With meds usually 2-3/10. However it wears off between doses when he is active. MME= 5. PMP Alert shows no  Discrepancy.Pt. Would like updated MRI to see if he is in need of surgical intervention.   Follow-up of hypertension. Patient has no history of headache chest pain or shortness of breath or recent cough. Patient also denies symptoms of TIA such as numbness weakness lateralizing. Patient checks  blood pressure at home and has not had any elevated readings recently. Patient denies side effects from his medication. States taking it regularly. Upcoming O down in the basement it at least before he had to go to the panel units I think could #11 or 13 on the left and that finger has a tendency when it is wet outside to them  Depression screen [facility] 2/9 06/18/2018 03/19/2018 12/18/2017  Decreased Interest 0 0 0  Down, Depressed, Hopeless 0 0 0  PHQ - 2 Score 0 0 0  Altered sleeping - - -  Tired, decreased energy - - -  Change in appetite - - -  Feeling bad or failure about yourself  - - -  Trouble concentrating - - -  Moving slowly or fidgety/restless - - -  Suicidal thoughts - - -  PHQ-9 Score - - -    History Kaiyu has a past medical history of Anxiety, Hypertension, and Sleep disorder.   He has a past surgical history that includes Knee surgery (Left); carpel tunnel left and right; Kidney stone surgery; and Tonsillectomy and adenoidectomy.   His family history includes Arthritis in his father and mother; Cancer in his mother and paternal grandmother; Diabetes in his mother.He reports  that he has been smoking cigarettes. He quit smokeless tobacco use about 21 months ago. He reports that he does not drink alcohol or use drugs.    ROS Review of Systems  Constitutional: Negative for chills, diaphoresis and fever.  HENT: Negative for sore throat.   Respiratory: Negative for shortness of breath.   Cardiovascular: Negative for chest pain.  Gastrointestinal: Negative for abdominal pain.  Musculoskeletal: Positive for arthralgias, back pain and myalgias. Negative for gait problem and neck pain.  Skin: Negative for rash.  Neurological: Negative for weakness and numbness.    Objective:  BP 124/72   Pulse 79   Temp 98 F (36.7 C) (Oral)   Ht 5\' 11"  (1.803 m)   Wt 222 lb (100.7 kg)   BMI 30.96 kg/m   BP Readings from Last 3 Encounters:  06/18/18 124/72  03/19/18 123/79  12/18/17 124/70    Wt Readings from Last 3 Encounters:  06/18/18 222 lb (100.7 kg)  03/19/18 222 lb 4 oz (100.8 kg)  12/18/17 216 lb (98 kg)     Physical Exam  Constitutional: He is oriented to person, place, and time. He appears well-developed and well-nourished. He appears distressed.  HENT:  Head: Normocephalic.  Eyes: Pupils are equal, round, and reactive to light.  Neck: Normal range of motion.  Cardiovascular: Normal rate, regular rhythm and normal heart  sounds.  No murmur heard. Pulmonary/Chest: Effort normal and breath sounds normal.  Abdominal: There is no tenderness.  Musculoskeletal: He exhibits tenderness (left lumbar musculature).  Neurological: He is alert and oriented to person, place, and time. He has normal reflexes.  Skin: Skin is warm and dry.  Psychiatric: His behavior is normal. Thought content normal.  Vitals reviewed.     Assessment & Plan:   Rolm Galarik was seen today for medical management of chronic issues.  Diagnoses and all orders for this visit:  HNP (herniated nucleus pulposus), lumbar  Chronic left-sided low back pain with left-sided  sciatica  Essential hypertension  Other orders -     HYDROcodone-acetaminophen (NORCO/VICODIN) 5-325 MG tablet; Take 1 tablet by mouth daily as needed for moderate pain. -     HYDROcodone-acetaminophen (NORCO) 5-325 MG tablet; Take 1 tablet by mouth daily as needed for moderate pain. -     HYDROcodone-acetaminophen (NORCO) 5-325 MG tablet; Take 1 tablet by mouth daily as needed for moderate pain. -     clonazePAM (KLONOPIN) 1 MG tablet; TAKE 1 AND 1/2 TABLETS DAILY BY MOUTH AT BEDTIME -     valsartan-hydrochlorothiazide (DIOVAN-HCT) 320-12.5 MG tablet; Take 1 tablet by mouth every morning. -     allopurinol (ZYLOPRIM) 100 MG tablet; Take 1 tablet (100 mg total) by mouth daily. -     diltiazem (CARDIZEM CD) 360 MG 24 hr capsule; TAKE 1 CAPSULE (360 MG TOTAL) BY MOUTH DAILY.       I have changed Rolm GalaErik Herdt's HYDROcodone-acetaminophen. I am also having him start on HYDROcodone-acetaminophen and HYDROcodone-acetaminophen. Additionally, I am having him maintain his clonazePAM, valsartan-hydrochlorothiazide, allopurinol, and diltiazem.  Allergies as of 06/18/2018      Reactions   Penicillins    Prednisone    Rage, allergic reaction      Medication List        Accurate as of 06/18/18 11:59 PM. Always use your most recent med list.          allopurinol 100 MG tablet Commonly known as:  ZYLOPRIM Take 1 tablet (100 mg total) by mouth daily.   clonazePAM 1 MG tablet Commonly known as:  KLONOPIN TAKE 1 AND 1/2 TABLETS DAILY BY MOUTH AT BEDTIME   diltiazem 360 MG 24 hr capsule Commonly known as:  CARDIZEM CD TAKE 1 CAPSULE (360 MG TOTAL) BY MOUTH DAILY.   HYDROcodone-acetaminophen 5-325 MG tablet Commonly known as:  NORCO/VICODIN Take 1 tablet by mouth daily as needed for moderate pain.   HYDROcodone-acetaminophen 5-325 MG tablet Commonly known as:  NORCO/VICODIN Take 1 tablet by mouth daily as needed for moderate pain. Start taking on:  07/18/2018   HYDROcodone-acetaminophen  5-325 MG tablet Commonly known as:  NORCO/VICODIN Take 1 tablet by mouth daily as needed for moderate pain. Start taking on:  08/17/2018   valsartan-hydrochlorothiazide 320-12.5 MG tablet Commonly known as:  DIOVAN-HCT Take 1 tablet by mouth every morning.        Follow-up: Return in about 3 months (around 09/18/2018).  Mechele ClaudeWarren Youa Deloney, M.D.

## 2018-07-03 ENCOUNTER — Encounter: Payer: Self-pay | Admitting: Family Medicine

## 2018-07-06 ENCOUNTER — Ambulatory Visit (INDEPENDENT_AMBULATORY_CARE_PROVIDER_SITE_OTHER): Payer: BLUE CROSS/BLUE SHIELD | Admitting: Nurse Practitioner

## 2018-07-06 ENCOUNTER — Encounter: Payer: Self-pay | Admitting: Nurse Practitioner

## 2018-07-06 ENCOUNTER — Telehealth: Payer: Self-pay | Admitting: Family Medicine

## 2018-07-06 VITALS — BP 121/71 | HR 101 | Temp 98.1°F | Ht 71.0 in | Wt 221.0 lb

## 2018-07-06 DIAGNOSIS — L02215 Cutaneous abscess of perineum: Secondary | ICD-10-CM

## 2018-07-06 MED ORDER — SULFAMETHOXAZOLE-TRIMETHOPRIM 800-160 MG PO TABS: 1.0000 | ORAL_TABLET | Freq: Two times a day (BID) | ORAL | 0 refills | Status: DC

## 2018-07-06 NOTE — Progress Notes (Signed)
   Subjective:    Patient ID: Philip Carbonrik Gonet, male    DOB: 1976/02/07, 42 y.o.   MRN: [id number]   Chief Complaint: Cyst on bottom   HPI Cystic lesion in groin area that has been growing for over a week. Tender to touch. Has history of MRSA    Review of Systems  Constitutional: Negative.   Respiratory: Negative.   Cardiovascular: Negative.   Musculoskeletal: Negative.   Neurological: Negative.   Psychiatric/Behavioral: Negative.   All other systems reviewed and are negative.      Objective:   Physical Exam  Constitutional: He is oriented to person, place, and time. He appears well-developed and well-nourished.  Cardiovascular: Normal rate and regular rhythm.  Pulmonary/Chest: Effort normal.  Neurological: He is alert and oriented to person, place, and time.  Skin: Skin is warm.  6cm indurated lesion right perineal area- tender to touch. No current drainage.   BP 121/71   Pulse (!) 101   Temp 98.1 F (36.7 C) (Oral)   Ht 5\' 11"  (1.803 m)   Wt 221 lb (100.2 kg)   BMI 30.82 kg/m         Assessment & Plan:  Philip Shepard in today with chief complaint of Cyst on bottom   1. Perineal abscess Warm soaks Out of work till sees Development worker, international aidgeneral surgeon - sulfamethoxazole-trimethoprim (BACTRIM DS) 800-160 MG tablet; Take 1 tablet by mouth 2 (two) times daily.  Dispense: 20 tablet; Refill: 0 - Ambulatory referral to General Surgery  Mary-Margaret Daphine DeutscherMartin, FNP

## 2018-07-06 NOTE — Telephone Encounter (Signed)
See if the acute care person for that they can see him and consider lancing this.

## 2018-07-06 NOTE — Patient Instructions (Signed)
Skin Abscess A skin abscess is an infected area on or under your skin that contains pus and other material. An abscess can happen almost anywhere on your body. Some abscesses break open (rupture) on their own. Most continue to get worse unless they are treated. The infection can spread deeper into the body and into your blood, which can make you feel sick. Treatment usually involves draining the abscess. Follow these instructions at home: Abscess Care  If you have an abscess that has not drained, place a warm, clean, wet washcloth over the abscess several times a day. Do this as told by your doctor.  Follow instructions from your doctor about how to take care of your abscess. Make sure you: ? Cover the abscess with a bandage (dressing). ? Change your bandage or gauze as told by your doctor. ? Wash your hands with soap and water before you change the bandage or gauze. If you cannot use soap and water, use hand sanitizer.  Check your abscess every day for signs that the infection is getting worse. Check for: ? More redness, swelling, or pain. ? More fluid or blood. ? Warmth. ? More pus or a bad smell. Medicines   Take over-the-counter and prescription medicines only as told by your doctor.  If you were prescribed an antibiotic medicine, take it as told by your doctor. Do not stop taking the antibiotic even if you start to feel better. General instructions  To avoid spreading the infection: ? Do not share personal care items, towels, or hot tubs with others. ? Avoid making skin-to-skin contact with other people.  Keep all follow-up visits as told by your doctor. This is important. Contact a doctor if:  You have more redness, swelling, or pain around your abscess.  You have more fluid or blood coming from your abscess.  Your abscess feels warm when you touch it.  You have more pus or a bad smell coming from your abscess.  You have a fever.  Your muscles ache.  You have  chills.  You feel sick. Get help right away if:  You have very bad (severe) pain.  You see red streaks on your skin spreading away from the abscess. This information is not intended to replace advice given to you by your health care provider. Make sure you discuss any questions you have with your health care provider. Document Released: 04/28/2008 Document Revised: 07/06/2016 Document Reviewed: 09/19/2015 Elsevier Interactive Patient Education  2018 [employer]  

## 2018-07-06 NOTE — Telephone Encounter (Signed)
Appt made

## 2018-07-06 NOTE — Telephone Encounter (Signed)
Pt has large cyst in rectal area Pt states Dr Darlyn ReadStacks have lanced this area before Pt states area is as large as a "hacky sack"  Pt also states he was told by Dr Darlyn ReadStacks not to go to ER due to MRSA Please advise

## 2018-07-07 ENCOUNTER — Telehealth: Payer: Self-pay | Admitting: Family Medicine

## 2018-07-07 NOTE — Telephone Encounter (Signed)
Please let him know that small amount is okay once & Thanks for letting me know WS

## 2018-07-07 NOTE — Telephone Encounter (Signed)
Attempted to contact patient - NVM 

## 2018-07-08 NOTE — Telephone Encounter (Signed)
rc about hydrocodone rx refill please call back

## 2018-07-08 NOTE — Telephone Encounter (Signed)
Informed [facility] it is okay to fill the prescription for Hydrocodone from his surgeon.

## 2018-08-26 DIAGNOSIS — G4733 Obstructive sleep apnea (adult) (pediatric): Secondary | ICD-10-CM | POA: Diagnosis not present

## 2018-09-13 ENCOUNTER — Telehealth: Payer: Self-pay | Admitting: Family Medicine

## 2018-09-13 NOTE — Telephone Encounter (Signed)
Pt wife has called in regarding to speak to Dr Darlyn Read about couple of his medications did not want to give more info

## 2018-09-14 NOTE — Telephone Encounter (Signed)
Patient has appointment next Friday and he is going to run out of the hydrocodone and clonazepam before then. They want to know if you can send in enough until appointment.

## 2018-09-14 NOTE — Telephone Encounter (Signed)
Wife  Aware. No refill until he has come in for an office visit.

## 2018-09-14 NOTE — Telephone Encounter (Signed)
No

## 2018-09-24 ENCOUNTER — Ambulatory Visit: Payer: BLUE CROSS/BLUE SHIELD | Admitting: Family Medicine

## 2018-09-24 ENCOUNTER — Encounter: Payer: Self-pay | Admitting: Family Medicine

## 2018-09-24 VITALS — BP 115/68 | HR 77 | Temp 97.1°F | Ht 71.0 in | Wt 227.0 lb

## 2018-09-24 DIAGNOSIS — M5442 Lumbago with sciatica, left side: Secondary | ICD-10-CM | POA: Diagnosis not present

## 2018-09-24 DIAGNOSIS — M5126 Other intervertebral disc displacement, lumbar region: Secondary | ICD-10-CM | POA: Diagnosis not present

## 2018-09-24 DIAGNOSIS — G8929 Other chronic pain: Secondary | ICD-10-CM

## 2018-09-24 DIAGNOSIS — F411 Generalized anxiety disorder: Secondary | ICD-10-CM | POA: Diagnosis not present

## 2018-09-24 DIAGNOSIS — I10 Essential (primary) hypertension: Secondary | ICD-10-CM

## 2018-09-24 MED ORDER — HYDROCODONE-ACETAMINOPHEN 5-325 MG PO TABS: 1.0000 | ORAL_TABLET | Freq: Every day | ORAL | 0 refills | Status: DC | PRN

## 2018-09-24 MED ORDER — VALSARTAN-HYDROCHLOROTHIAZIDE 320-12.5 MG PO TABS: 1.0000 | ORAL_TABLET | Freq: Every morning | ORAL | 1 refills | Status: DC

## 2018-09-24 MED ORDER — DILTIAZEM HCL ER COATED BEADS 360 MG PO CP24: ORAL_CAPSULE | ORAL | 1 refills | Status: DC

## 2018-09-24 MED ORDER — ALLOPURINOL 100 MG PO TABS: 100.0000 mg | ORAL_TABLET | Freq: Every day | ORAL | 1 refills | Status: DC

## 2018-09-24 MED ORDER — CLONAZEPAM 1 MG PO TABS: ORAL_TABLET | ORAL | 2 refills | Status: DC

## 2018-09-24 NOTE — Progress Notes (Signed)
Subjective:  Patient ID: Philip Shepard, male    DOB: [birth year]  Age: 42 y.o. MRN: [id number]  CC: Back Pain (3 mos); Anxiety; and Hypertension   HPI Philip Shepard presents for refill of pain and sleep meds. Pt. Ran out of meds. (May have negative result on toxassure.) Pain not well controlled as he only takes the med at bedtime to allow him to sleep. Pain is 6/10. Related to lumbar radiculopathy from herniated disc. Worse recently due to increased physical labor requirements at work.   Depression screen [facility] 2/9 09/24/2018 07/06/2018 06/18/2018  Decreased Interest 0 0 0  Down, Depressed, Hopeless 0 0 0  PHQ - 2 Score 0 0 0  Altered sleeping - - -  Tired, decreased energy - - -  Change in appetite - - -  Feeling bad or failure about yourself  - - -  Trouble concentrating - - -  Moving slowly or fidgety/restless - - -  Suicidal thoughts - - -  PHQ-9 Score - - -    History Philip Shepard has a past medical history of Anxiety, Hypertension, and Sleep disorder.   He has a past surgical history that includes Knee surgery (Left); carpel tunnel left and right; Kidney stone surgery; and Tonsillectomy and adenoidectomy.   His family history includes Arthritis in his father and mother; Cancer in his mother and paternal grandmother; Diabetes in his mother.He reports that he has been smoking cigarettes. He quit smokeless tobacco use about 2 years ago. He reports that he does not drink alcohol or use drugs.    ROS Review of Systems  Constitutional: Negative.   HENT: Negative.   Eyes: Negative for visual disturbance.  Respiratory: Negative for cough and shortness of breath.   Cardiovascular: Negative for chest pain and leg swelling.  Gastrointestinal: Negative for abdominal pain, diarrhea, nausea and vomiting.  Genitourinary: Negative for difficulty urinating.  Musculoskeletal: Positive for arthralgias and back pain. Negative for myalgias.  Skin: Negative for rash.  Neurological: Negative for headaches.   Psychiatric/Behavioral: Negative for sleep disturbance.    Objective:  BP 115/68   Pulse 77   Temp (!) 97.1 F (36.2 C) (Oral)   Ht 5\' 11"  (1.803 m)   Wt 227 lb (103 kg)   BMI 31.66 kg/m   BP Readings from Last 3 Encounters:  09/24/18 115/68  07/06/18 121/71  06/18/18 124/72    Wt Readings from Last 3 Encounters:  09/24/18 227 lb (103 kg)  07/06/18 221 lb (100.2 kg)  06/18/18 222 lb (100.7 kg)     Physical Exam  Constitutional: He is oriented to person, place, and time. He appears well-developed and well-nourished. No distress.  HENT:  Head: Normocephalic and atraumatic.  Right Ear: External ear normal.  Left Ear: External ear normal.  Nose: Nose normal.  Mouth/Throat: Oropharynx is clear and moist.  Eyes: Pupils are equal, round, and reactive to light. Conjunctivae and EOM are normal.  Neck: Normal range of motion. Neck supple.  Cardiovascular: Normal rate, regular rhythm and normal heart sounds.  No murmur heard. Pulmonary/Chest: Effort normal and breath sounds normal. No respiratory distress. He has no wheezes. He has no rales.  Abdominal: Soft. There is no tenderness.  Musculoskeletal: Normal range of motion.  Neurological: He is alert and oriented to person, place, and time. He has normal reflexes.  Skin: Skin is warm and dry.  Psychiatric: He has a normal mood and affect. His behavior is normal. Judgment and thought content normal.  Assessment & Plan:   Philip Shepard was seen today for back pain, anxiety and hypertension.  Diagnoses and all orders for this visit:  HNP (herniated nucleus pulposus), lumbar -     ToxASSURE Select 13 (MW), Urine  Essential hypertension  Generalized anxiety disorder  Chronic left-sided low back pain with left-sided sciatica  Other orders -     valsartan-hydrochlorothiazide (DIOVAN-HCT) 320-12.5 MG tablet; Take 1 tablet by mouth every morning. -     HYDROcodone-acetaminophen (NORCO/VICODIN) 5-325 MG tablet; Take 1 tablet  by mouth daily as needed for moderate pain. -     HYDROcodone-acetaminophen (NORCO) 5-325 MG tablet; Take 1 tablet by mouth daily as needed for moderate pain. -     HYDROcodone-acetaminophen (NORCO) 5-325 MG tablet; Take 1 tablet by mouth daily as needed for moderate pain. -     diltiazem (CARDIZEM CD) 360 MG 24 hr capsule; TAKE 1 CAPSULE (360 MG TOTAL) BY MOUTH DAILY. -     clonazePAM (KLONOPIN) 1 MG tablet; TAKE 1 AND 1/2 TABLETS DAILY BY MOUTH AT BEDTIME -     allopurinol (ZYLOPRIM) 100 MG tablet; Take 1 tablet (100 mg total) by mouth daily.       I have discontinued Philip Shepard's sulfamethoxazole-trimethoprim. I am also having him maintain his valsartan-hydrochlorothiazide, HYDROcodone-acetaminophen, HYDROcodone-acetaminophen, HYDROcodone-acetaminophen, diltiazem, clonazePAM, and allopurinol.  Allergies as of 09/24/2018      Reactions   Penicillins    Prednisone    Rage, allergic reaction      Medication List        Accurate as of 09/24/18  2:54 PM. Always use your most recent med list.          allopurinol 100 MG tablet Commonly known as:  ZYLOPRIM Take 1 tablet (100 mg total) by mouth daily.   clonazePAM 1 MG tablet Commonly known as:  KLONOPIN TAKE 1 AND 1/2 TABLETS DAILY BY MOUTH AT BEDTIME   diltiazem 360 MG 24 hr capsule Commonly known as:  CARDIZEM CD TAKE 1 CAPSULE (360 MG TOTAL) BY MOUTH DAILY.   HYDROcodone-acetaminophen 5-325 MG tablet Commonly known as:  NORCO/VICODIN Take 1 tablet by mouth daily as needed for moderate pain.   HYDROcodone-acetaminophen 5-325 MG tablet Commonly known as:  NORCO/VICODIN Take 1 tablet by mouth daily as needed for moderate pain. Start taking on:  10/24/2018   HYDROcodone-acetaminophen 5-325 MG tablet Commonly known as:  NORCO/VICODIN Take 1 tablet by mouth daily as needed for moderate pain. Start taking on:  11/23/2018   valsartan-hydrochlorothiazide 320-12.5 MG tablet Commonly known as:  DIOVAN-HCT Take 1 tablet by  mouth every morning.        Follow-up: Return in about 3 months (around 12/25/2018).  Mechele Claude, M.D.

## 2018-09-30 LAB — TOXASSURE SELECT 13 (MW), URINE

## 2018-11-30 DIAGNOSIS — G4733 Obstructive sleep apnea (adult) (pediatric): Secondary | ICD-10-CM | POA: Diagnosis not present

## 2018-12-10 ENCOUNTER — Ambulatory Visit: Payer: BLUE CROSS/BLUE SHIELD | Admitting: Family Medicine

## 2018-12-16 ENCOUNTER — Ambulatory Visit: Payer: BLUE CROSS/BLUE SHIELD | Admitting: Family Medicine

## 2018-12-16 ENCOUNTER — Encounter: Payer: Self-pay | Admitting: Family Medicine

## 2018-12-16 VITALS — BP 126/72 | HR 75 | Temp 97.1°F | Ht 71.0 in | Wt 232.2 lb

## 2018-12-16 DIAGNOSIS — E782 Mixed hyperlipidemia: Secondary | ICD-10-CM | POA: Diagnosis not present

## 2018-12-16 DIAGNOSIS — M5126 Other intervertebral disc displacement, lumbar region: Secondary | ICD-10-CM

## 2018-12-16 DIAGNOSIS — I10 Essential (primary) hypertension: Secondary | ICD-10-CM

## 2018-12-16 DIAGNOSIS — F411 Generalized anxiety disorder: Secondary | ICD-10-CM | POA: Diagnosis not present

## 2018-12-16 DIAGNOSIS — M5136 Other intervertebral disc degeneration, lumbar region: Secondary | ICD-10-CM

## 2018-12-16 MED ORDER — ALLOPURINOL 100 MG PO TABS: 100.0000 mg | ORAL_TABLET | Freq: Every day | ORAL | 1 refills | Status: DC

## 2018-12-16 MED ORDER — DILTIAZEM HCL ER COATED BEADS 360 MG PO CP24: ORAL_CAPSULE | ORAL | 1 refills | Status: DC

## 2018-12-16 MED ORDER — CLONAZEPAM 1 MG PO TABS: ORAL_TABLET | ORAL | 2 refills | Status: DC

## 2018-12-16 MED ORDER — HYDROCODONE-ACETAMINOPHEN 5-325 MG PO TABS: 1.0000 | ORAL_TABLET | Freq: Every day | ORAL | 0 refills | Status: AC | PRN

## 2018-12-16 MED ORDER — HYDROCODONE-ACETAMINOPHEN 5-325 MG PO TABS: 1.0000 | ORAL_TABLET | Freq: Every day | ORAL | 0 refills | Status: DC | PRN

## 2018-12-16 MED ORDER — VALSARTAN-HYDROCHLOROTHIAZIDE 320-12.5 MG PO TABS: 1.0000 | ORAL_TABLET | Freq: Every morning | ORAL | 1 refills | Status: DC

## 2018-12-16 NOTE — Progress Notes (Signed)
Subjective:  Patient ID: Philip Shepard, male    DOB: [birth year]  Age: 43 y.o. MRN: [id number]  CC: Medical Management of Chronic Issues (three month recheck)   HPI Philip Shepard presents for  follow-up of hypertension. Patient has no history of headache chest pain or shortness of breath or recent cough. Patient also denies symptoms of TIA such as focal numbness or weakness. Patient denies side effects from medication. States taking it regularly. Pain is manageable but he has daily discomfort.  This is primarily in the lower back.  It does make it very hard for him to complete his tasks at work.  Today he is quite distraught about his father's illness.  Apparently his brain tumor has recurred and he may have to have surgery on that.  Patient needs an MRI to further understand what is going on to see if he needs to see neurosurgery.  Pain is 5-6/10 daily.  Patient is taking hydrocodone.  He has 12 pills left today. History Philip Shepard has a past medical history of Anxiety, Hypertension, and Sleep disorder.   He has a past surgical history that includes Knee surgery (Left); carpel tunnel left and right; Kidney stone surgery; and Tonsillectomy and adenoidectomy.   His family history includes Arthritis in his father and mother; Cancer in his mother and paternal grandmother; Diabetes in his mother.He reports that he has been smoking cigarettes. He quit smokeless tobacco use about 2 years ago. He reports that he does not drink alcohol or use drugs.  No current outpatient medications on file prior to visit.   No current facility-administered medications on file prior to visit.     ROS Review of Systems  Constitutional: Negative.   HENT: Positive for congestion.   Eyes: Negative for visual disturbance.  Respiratory: Negative for cough and shortness of breath.   Cardiovascular: Negative for chest pain and leg swelling.  Gastrointestinal: Negative for abdominal pain, diarrhea, nausea and vomiting.    Genitourinary: Negative for difficulty urinating.  Musculoskeletal: Positive for arthralgias and back pain. Negative for myalgias.  Skin: Negative for rash.  Neurological: Negative for headaches.  Psychiatric/Behavioral: Negative for sleep disturbance.    Objective:  BP 126/72   Pulse 75   Temp (!) 97.1 F (36.2 C) (Oral)   Ht 5' 11"  (1.803 m)   Wt 232 lb 3.2 oz (105.3 kg)   BMI 32.39 kg/m   BP Readings from Last 3 Encounters:  12/16/18 126/72  09/24/18 115/68  07/06/18 121/71    Wt Readings from Last 3 Encounters:  12/16/18 232 lb 3.2 oz (105.3 kg)  09/24/18 227 lb (103 kg)  07/06/18 221 lb (100.2 kg)     Physical Exam Vitals signs reviewed.  Constitutional:      Appearance: He is well-developed.  HENT:     Head: Normocephalic and atraumatic.     Right Ear: Tympanic membrane and external ear normal. No decreased hearing noted.     Left Ear: Tympanic membrane and external ear normal. No decreased hearing noted.     Mouth/Throat:     Pharynx: No oropharyngeal exudate or posterior oropharyngeal erythema.  Eyes:     Pupils: Pupils are equal, round, and reactive to light.  Neck:     Musculoskeletal: Normal range of motion and neck supple.  Cardiovascular:     Rate and Rhythm: Normal rate and regular rhythm.     Heart sounds: No murmur.  Pulmonary:     Effort: No respiratory distress.     Breath  sounds: Normal breath sounds.  Abdominal:     General: Bowel sounds are normal.     Palpations: Abdomen is soft. There is no mass.     Tenderness: There is no abdominal tenderness.  Musculoskeletal:        General: Tenderness (Lumbar paraspinous musculature) present.       Assessment & Plan:   Klye was seen today for medical management of chronic issues.  Diagnoses and all orders for this visit:  Essential hypertension -     CBC with Differential/Platelet -     CMP14+EGFR  Generalized anxiety disorder  HNP (herniated nucleus pulposus), lumbar  Mixed  hyperlipidemia -     Lipid panel  Other intervertebral disc degeneration, lumbar region -     MR Lumbar Spine Wo Contrast; Future  Other orders -     clonazePAM (KLONOPIN) 1 MG tablet; TAKE 1 AND 1/2 TABLETS DAILY BY MOUTH AT BEDTIME -     HYDROcodone-acetaminophen (NORCO) 5-325 MG tablet; Take 1 tablet by mouth daily as needed for moderate pain. -     HYDROcodone-acetaminophen (NORCO/VICODIN) 5-325 MG tablet; Take 1 tablet by mouth daily as needed for up to 30 days for moderate pain. -     HYDROcodone-acetaminophen (NORCO) 5-325 MG tablet; Take 1 tablet by mouth daily as needed for up to 30 days for moderate pain. -     allopurinol (ZYLOPRIM) 100 MG tablet; Take 1 tablet (100 mg total) by mouth daily. -     diltiazem (CARDIZEM CD) 360 MG 24 hr capsule; TAKE 1 CAPSULE (360 MG TOTAL) BY MOUTH DAILY. -     valsartan-hydrochlorothiazide (DIOVAN-HCT) 320-12.5 MG tablet; Take 1 tablet by mouth every morning.   Allergies as of 12/16/2018      Reactions   Penicillins    Prednisone    Rage, allergic reaction      Medication List       Accurate as of December 16, 2018 11:59 PM. Always use your most recent med list.        allopurinol 100 MG tablet Commonly known as:  ZYLOPRIM Take 1 tablet (100 mg total) by mouth daily.   clonazePAM 1 MG tablet Commonly known as:  KLONOPIN TAKE 1 AND 1/2 TABLETS DAILY BY MOUTH AT BEDTIME   diltiazem 360 MG 24 hr capsule Commonly known as:  CARDIZEM CD TAKE 1 CAPSULE (360 MG TOTAL) BY MOUTH DAILY.   HYDROcodone-acetaminophen 5-325 MG tablet Commonly known as:  NORCO Take 1 tablet by mouth daily as needed for up to 30 days for moderate pain.   HYDROcodone-acetaminophen 5-325 MG tablet Commonly known as:  NORCO/VICODIN Take 1 tablet by mouth daily as needed for up to 30 days for moderate pain. Start taking on:  January 15, 2019   HYDROcodone-acetaminophen 5-325 MG tablet Commonly known as:  NORCO Take 1 tablet by mouth daily as needed for  moderate pain. Start taking on:  February 14, 2019   valsartan-hydrochlorothiazide 320-12.5 MG tablet Commonly known as:  DIOVAN-HCT Take 1 tablet by mouth every morning.       Meds ordered this encounter  Medications  . clonazePAM (KLONOPIN) 1 MG tablet    Sig: TAKE 1 AND 1/2 TABLETS DAILY BY MOUTH AT BEDTIME    Dispense:  45 tablet    Refill:  2    Not to exceed 3 additional fills before 06/16/2018.  Marland Kitchen HYDROcodone-acetaminophen (NORCO) 5-325 MG tablet    Sig: Take 1 tablet by mouth daily as needed for moderate  pain.    Dispense:  30 tablet    Refill:  0  . HYDROcodone-acetaminophen (NORCO/VICODIN) 5-325 MG tablet    Sig: Take 1 tablet by mouth daily as needed for up to 30 days for moderate pain.    Dispense:  30 tablet    Refill:  0  . HYDROcodone-acetaminophen (NORCO) 5-325 MG tablet    Sig: Take 1 tablet by mouth daily as needed for up to 30 days for moderate pain.    Dispense:  30 tablet    Refill:  0  . allopurinol (ZYLOPRIM) 100 MG tablet    Sig: Take 1 tablet (100 mg total) by mouth daily.    Dispense:  90 tablet    Refill:  1  . diltiazem (CARDIZEM CD) 360 MG 24 hr capsule    Sig: TAKE 1 CAPSULE (360 MG TOTAL) BY MOUTH DAILY.    Dispense:  90 capsule    Refill:  1  . valsartan-hydrochlorothiazide (DIOVAN-HCT) 320-12.5 MG tablet    Sig: Take 1 tablet by mouth every morning.    Dispense:  90 tablet    Refill:  1      Follow-up: Return in about 3 months (around 03/17/2019).  Claretta Fraise, M.D.

## 2018-12-16 NOTE — Patient Instructions (Signed)
Use Allegra D 24 OTC for congestion.

## 2018-12-17 LAB — CBC WITH DIFFERENTIAL/PLATELET
BASOS: 1 %
Basophils Absolute: 0.1 10*3/uL (ref 0.0–0.2)
EOS (ABSOLUTE): 0.1 10*3/uL (ref 0.0–0.4)
EOS: 2 %
Hematocrit: 43.5 % (ref 37.5–51.0)
Hemoglobin: 15.7 g/dL (ref 13.0–17.7)
IMMATURE GRANULOCYTES: 0 %
Immature Grans (Abs): 0 10*3/uL (ref 0.0–0.1)
LYMPHS ABS: 2.9 10*3/uL (ref 0.7–3.1)
Lymphs: 37 %
MCH: 32.2 pg (ref 26.6–33.0)
MCHC: 36.1 g/dL — ABNORMAL HIGH (ref 31.5–35.7)
MCV: 89 fL (ref 79–97)
MONOS ABS: 0.7 10*3/uL (ref 0.1–0.9)
Monocytes: 9 %
NEUTROS PCT: 51 %
Neutrophils Absolute: 4 10*3/uL (ref 1.4–7.0)
Platelets: 264 10*3/uL (ref 150–450)
RBC: 4.88 x10E6/uL (ref 4.14–5.80)
RDW: 13 % (ref 11.6–15.4)
WBC: 7.8 10*3/uL (ref 3.4–10.8)

## 2018-12-17 LAB — CMP14+EGFR
ALT: 49 IU/L — ABNORMAL HIGH (ref 0–44)
AST: 25 IU/L (ref 0–40)
Albumin/Globulin Ratio: 2.1 (ref 1.2–2.2)
Albumin: 4.8 g/dL (ref 4.0–5.0)
Alkaline Phosphatase: 67 IU/L (ref 39–117)
BUN/Creatinine Ratio: 17 (ref 9–20)
BUN: 17 mg/dL (ref 6–24)
Bilirubin Total: 0.2 mg/dL (ref 0.0–1.2)
CALCIUM: 9.3 mg/dL (ref 8.7–10.2)
CO2: 23 mmol/L (ref 20–29)
Chloride: 99 mmol/L (ref 96–106)
Creatinine, Ser: 1.03 mg/dL (ref 0.76–1.27)
GFR, EST AFRICAN AMERICAN: 105 mL/min/{1.73_m2} (ref >59)
GFR, EST NON AFRICAN AMERICAN: 90 mL/min/{1.73_m2} (ref >59)
GLOBULIN, TOTAL: 2.3 g/dL (ref 1.5–4.5)
Glucose: 87 mg/dL (ref 65–99)
POTASSIUM: 4.2 mmol/L (ref 3.5–5.2)
Sodium: 140 mmol/L (ref 134–144)
Total Protein: 7.1 g/dL (ref 6.0–8.5)

## 2018-12-17 LAB — LIPID PANEL
Chol/HDL Ratio: 4.5 ratio (ref 0.0–5.0)
Cholesterol, Total: 176 mg/dL (ref 100–199)
HDL: 39 mg/dL — ABNORMAL LOW (ref >39)
LDL Calculated: 102 mg/dL — ABNORMAL HIGH (ref 0–99)
TRIGLYCERIDES: 174 mg/dL — AB (ref 0–149)
VLDL Cholesterol Cal: 35 mg/dL (ref 5–40)

## 2018-12-20 NOTE — Progress Notes (Signed)
Hello Philip Shepard,  Your lab result is normal.Some minor variations that are not significant are commonly marked abnormal, but do not represent any medical problem for you.  Best regards, Mechele Claude, M.D.

## 2018-12-22 ENCOUNTER — Ambulatory Visit: Payer: BLUE CROSS/BLUE SHIELD | Admitting: Family Medicine

## 2018-12-24 ENCOUNTER — Encounter: Payer: Self-pay | Admitting: Family Medicine

## 2019-01-03 ENCOUNTER — Telehealth: Payer: Self-pay | Admitting: Family Medicine

## 2019-01-03 DIAGNOSIS — N492 Inflammatory disorders of scrotum: Secondary | ICD-10-CM | POA: Diagnosis not present

## 2019-01-03 NOTE — Telephone Encounter (Signed)
I can see him and lance it tomorrow (acute day appropriate problem)

## 2019-01-03 NOTE — Telephone Encounter (Signed)
Patient states that abcess on genitals near the base of penis. General sent to patient to ED and the urologist only wanted to give antibiotic.  Patient states that General surgeon states that there are 3 different abcess.  Patient states that urologist would not [place].   Patient states that he got tired of waiting in the ED with doctoring "poking at it" so he walked out

## 2019-01-03 NOTE — Telephone Encounter (Signed)
Pt. Needs to be seen for this. Thanks, WS 

## 2019-01-04 ENCOUNTER — Ambulatory Visit (INDEPENDENT_AMBULATORY_CARE_PROVIDER_SITE_OTHER): Payer: BLUE CROSS/BLUE SHIELD | Admitting: Family Medicine

## 2019-01-04 ENCOUNTER — Encounter: Payer: Self-pay | Admitting: Family Medicine

## 2019-01-04 VITALS — BP 135/76 | HR 90 | Temp 97.7°F | Ht 71.0 in

## 2019-01-04 DIAGNOSIS — N492 Inflammatory disorders of scrotum: Secondary | ICD-10-CM | POA: Diagnosis not present

## 2019-01-04 MED ORDER — MINOCYCLINE HCL 100 MG PO CAPS: 100.0000 mg | ORAL_CAPSULE | Freq: Two times a day (BID) | ORAL | 0 refills | Status: DC

## 2019-01-04 NOTE — Telephone Encounter (Signed)
Apt made today at 2:55 with [facility].

## 2019-01-04 NOTE — Progress Notes (Signed)
Chief Complaint  Patient presents with  . Abscess    HPI  Patient presents today for abscess at base of penis. Painful. Growing for several days. Went to E.D. yesterday. Left because of repeated exams without treatment. 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   PMH: Smoking status noted ROS: Per HPI  Objective: BP 135/76   Pulse 90   Temp 97.7 F (36.5 C) (Oral)   Ht 5\' 11"  (1.803 m)   BMI 32.39 kg/m  Gen: NAD, alert, cooperative with exam HEENT: NCAT, EOMI, PERRL CV: RRR, good S1/S2, no murmur Resp: CTABL, no wheezes, non-labored GU: Scrotal lesion, 1cm, nodule with fluctuance at the central, superior scrotum. Has come to a head at center and off to left slightly.  Ext: No edema, warm Neuro: Alert and oriented, No gross deficits The lesion was prepped and draped in sterile fashion with Betadine and sterile drape.  Local with 2% lidocaine with epinephrine.  11 blade scalpel used to incise into the subcutaneous tissue scissor technique used to break into loculations with mild purulent/sebaceous matter and 2 to 3 mL of blood.  Bleeding controlled with direct pressure.  Gauze dressing applied.  Wound care discussed. Assessment and plan:  1. Abscess of scrotal wall     Meds ordered this encounter  Medications  . minocycline (MINOCIN) 100 MG capsule    Sig: Take 1 capsule (100 mg total) by mouth 2 (two) times daily. Take on an empty stomach    Dispense:  20 capsule    Refill:  0    No orders of the defined types were placed in this encounter.   Follow up as needed.  Mechele Claude, MD

## 2019-01-07 ENCOUNTER — Encounter: Payer: Self-pay | Admitting: Family Medicine

## 2019-01-07 DIAGNOSIS — M5136 Other intervertebral disc degeneration, lumbar region: Secondary | ICD-10-CM | POA: Diagnosis not present

## 2019-01-07 DIAGNOSIS — M48061 Spinal stenosis, lumbar region without neurogenic claudication: Secondary | ICD-10-CM | POA: Diagnosis not present

## 2019-01-07 DIAGNOSIS — M47896 Other spondylosis, lumbar region: Secondary | ICD-10-CM | POA: Diagnosis not present

## 2019-01-07 DIAGNOSIS — M5126 Other intervertebral disc displacement, lumbar region: Secondary | ICD-10-CM | POA: Diagnosis not present

## 2019-01-21 ENCOUNTER — Telehealth: Payer: Self-pay | Admitting: Family Medicine

## 2019-01-21 NOTE — Telephone Encounter (Signed)
Have you seen the report for his MRI?

## 2019-01-21 NOTE — Telephone Encounter (Signed)
PT states that he hasn't been read the results to his MRI he had and needs to know if he can get those and if he needs to go to chiropractor?

## 2019-03-15 ENCOUNTER — Ambulatory Visit: Payer: BLUE CROSS/BLUE SHIELD | Admitting: Family Medicine

## 2019-03-18 ENCOUNTER — Other Ambulatory Visit: Payer: Self-pay

## 2019-03-18 ENCOUNTER — Ambulatory Visit (INDEPENDENT_AMBULATORY_CARE_PROVIDER_SITE_OTHER): Payer: BLUE CROSS/BLUE SHIELD | Admitting: Family Medicine

## 2019-03-18 DIAGNOSIS — I10 Essential (primary) hypertension: Secondary | ICD-10-CM | POA: Diagnosis not present

## 2019-03-18 DIAGNOSIS — G8929 Other chronic pain: Secondary | ICD-10-CM | POA: Diagnosis not present

## 2019-03-18 DIAGNOSIS — M5126 Other intervertebral disc displacement, lumbar region: Secondary | ICD-10-CM | POA: Diagnosis not present

## 2019-03-18 DIAGNOSIS — M5442 Lumbago with sciatica, left side: Secondary | ICD-10-CM | POA: Diagnosis not present

## 2019-03-19 ENCOUNTER — Other Ambulatory Visit: Payer: Self-pay | Admitting: Family Medicine

## 2019-03-21 ENCOUNTER — Encounter: Payer: Self-pay | Admitting: Family Medicine

## 2019-03-21 ENCOUNTER — Telehealth: Payer: Self-pay | Admitting: Family Medicine

## 2019-03-21 MED ORDER — HYDROCODONE-ACETAMINOPHEN 5-325 MG PO TABS: 1.0000 | ORAL_TABLET | Freq: Every day | ORAL | 0 refills | Status: DC | PRN

## 2019-03-21 MED ORDER — CLONAZEPAM 1 MG PO TABS: ORAL_TABLET | ORAL | 2 refills | Status: DC

## 2019-03-21 MED ORDER — HYDROCODONE-ACETAMINOPHEN 5-325 MG PO TABS: 1.0000 | ORAL_TABLET | Freq: Every evening | ORAL | 0 refills | Status: DC | PRN

## 2019-03-21 NOTE — Telephone Encounter (Signed)
Pt wife aware - [facility] is working on it.

## 2019-03-21 NOTE — Telephone Encounter (Signed)
Pt had a telephone visit on Friday with Dr Darlyn Read, pt wife states that the klonpin and vicodan was not sent in [facility]. He was told that Dr Selina Cooley was going to send in 3 month supply. Please call wife once sent to pharmacy

## 2019-03-21 NOTE — Progress Notes (Signed)
Subjective:    Patient ID: Philip Shepard, male    DOB: [birth year], 43 y.o.   MRN: [id number]   HPI: Philip Shepard is a 43 y.o. male presenting for follow-up of hypertension. Patient has no history of headache chest pain or shortness of breath or recent cough. Patient also denies symptoms of TIA such as numbness weakness lateralizing. Patient checks  blood pressure at home and has not had any elevated readings recently. Patient denies side effects from his medication. States taking it regularly. Pain assessment: Cause of pain- lumbar herniated disc Pain location- lumbar midline Pain on scale of 1-10- 6 Frequency- daily What increases pain-exertion with bending, lifting squatting What makes pain Better-heat, rest on back, med Effects on ADL - slows grooming, toileting Any change in general medical condition-denies  Current opioids rx- hydrocodone Effectiveness of current meds-insufficient, but pt. Unwilling to use more due to concern for habituation Adverse reactions form pain meds-denies Morphine equivalent- 5  Pill count performed-Yes (pt counted over the phone) Last drug screen - 09/24/18 ( high risk q69m, moderate risk q57m, low risk yearly ) Urine drug screen today- No Was the NCCSR reviewed- yes  If yes were their any concerning findings? - no  No flowsheet data found.       Depression screen [facility] 2/9 12/16/2018 12/16/2018 09/24/2018 07/06/2018 06/18/2018  Decreased Interest 0 0 0 0 0  Down, Depressed, Hopeless 0 0 0 0 0  PHQ - 2 Score 0 0 0 0 0  Altered sleeping - - - - -  Tired, decreased energy - - - - -  Change in appetite - - - - -  Feeling bad or failure about yourself  - - - - -  Trouble concentrating - - - - -  Moving slowly or fidgety/restless - - - - -  Suicidal thoughts - - - - -  PHQ-9 Score - - - - -     Relevant past medical, surgical, family and social history reviewed and updated as indicated.  Interim medical history since our last visit reviewed.  Allergies and medications reviewed and updated.  ROS:  Review of Systems   Social History   Tobacco Use  Smoking Status Current Every Day Smoker  . Types: Cigarettes  Smokeless Tobacco Former Neurosurgeon  . Quit date: 09/04/2016       Objective:     Wt Readings from Last 3 Encounters:  12/16/18 232 lb 3.2 oz (105.3 kg)  09/24/18 227 lb (103 kg)  07/06/18 221 lb (100.2 kg)     Exam deferred. Pt. Harboring due to COVID 19. Phone visit performed.   Assessment & Plan:   1. HNP (herniated nucleus pulposus), lumbar   2. Chronic left-sided low back pain with left-sided sciatica   3. Essential hypertension     Meds ordered this encounter  Medications  . DISCONTD: HYDROcodone-acetaminophen (NORCO) 5-325 MG tablet    Sig: Take 1 tablet by mouth daily as needed for moderate pain.    Dispense:  30 tablet    Refill:  0  . HYDROcodone-acetaminophen (NORCO) 5-325 MG tablet    Sig: Take 1 tablet by mouth at bedtime as needed for moderate pain. Take for moderate to severe pain    Dispense:  30 tablet    Refill:  0  . HYDROcodone-acetaminophen (NORCO) 5-325 MG tablet    Sig: Take 1 tablet by mouth daily as needed for moderate pain.    Dispense:  30 tablet    Refill:  0  . clonazePAM (KLONOPIN) 1 MG tablet    Sig: TAKE 1 AND 1/2 TABLETS DAILY BY MOUTH AT BEDTIME    Dispense:  45 tablet    Refill:  2    Not to exceed 3 additional fills before 06/16/2018.    No orders of the defined types were placed in this encounter.     Diagnoses and all orders for this visit:  HNP (herniated nucleus pulposus), lumbar  Chronic left-sided low back pain with left-sided sciatica  Essential hypertension  Other orders -     Discontinue: HYDROcodone-acetaminophen (NORCO) 5-325 MG tablet; Take 1 tablet by mouth daily as needed for moderate pain. -     HYDROcodone-acetaminophen (NORCO) 5-325 MG tablet; Take 1 tablet by mouth at bedtime as needed for moderate pain. Take for moderate to severe pain  -     HYDROcodone-acetaminophen (NORCO) 5-325 MG tablet; Take 1 tablet by mouth daily as needed for moderate pain. -     clonazePAM (KLONOPIN) 1 MG tablet; TAKE 1 AND 1/2 TABLETS DAILY BY MOUTH AT BEDTIME    Virtual Visit via telephone Note  I discussed the limitations, risks, security and privacy concerns of performing an evaluation and management service by telephone and the availability of in person appointments. The patient was identified with two identifiers. Pt.expressed understanding and agreed to proceed. Pt. Is at home. Dr. Darlyn ReadStacks is in his office.  Follow Up Instructions:   I discussed the assessment and treatment plan with the patient. The patient was provided an opportunity to ask questions and all were answered. The patient agreed with the plan and demonstrated an understanding of the instructions.   The patient was advised to call back or seek an in-person evaluation if the symptoms worsen or if the condition fails to improve as anticipated.  Visit started: 1:00 Call ended:  1:20 Total minutes including chart review and phone contact time: 27   Follow up plan: Return in about 3 months (around 06/17/2019).  Mechele ClaudeWarren Perley Arthurs, MD [facility]

## 2019-03-21 NOTE — Telephone Encounter (Signed)
Thanks! Will take care of it now. WS 

## 2019-04-08 DIAGNOSIS — G4733 Obstructive sleep apnea (adult) (pediatric): Secondary | ICD-10-CM | POA: Diagnosis not present

## 2019-04-11 DIAGNOSIS — G4733 Obstructive sleep apnea (adult) (pediatric): Secondary | ICD-10-CM | POA: Diagnosis not present

## 2019-06-01 ENCOUNTER — Ambulatory Visit (INDEPENDENT_AMBULATORY_CARE_PROVIDER_SITE_OTHER): Payer: BC Managed Care – PPO | Admitting: Family Medicine

## 2019-06-01 ENCOUNTER — Other Ambulatory Visit: Payer: Self-pay

## 2019-06-01 DIAGNOSIS — M5126 Other intervertebral disc displacement, lumbar region: Secondary | ICD-10-CM

## 2019-06-01 DIAGNOSIS — G8929 Other chronic pain: Secondary | ICD-10-CM

## 2019-06-01 DIAGNOSIS — F411 Generalized anxiety disorder: Secondary | ICD-10-CM

## 2019-06-01 DIAGNOSIS — M5442 Lumbago with sciatica, left side: Secondary | ICD-10-CM | POA: Diagnosis not present

## 2019-06-01 MED ORDER — CLONAZEPAM 1 MG PO TABS: ORAL_TABLET | ORAL | 2 refills | Status: DC

## 2019-06-01 MED ORDER — ALLOPURINOL 100 MG PO TABS: 100.0000 mg | ORAL_TABLET | Freq: Every day | ORAL | 1 refills | Status: DC

## 2019-06-01 MED ORDER — HYDROCODONE-ACETAMINOPHEN 5-325 MG PO TABS: 1.0000 | ORAL_TABLET | Freq: Every day | ORAL | 0 refills | Status: DC | PRN

## 2019-06-01 MED ORDER — HYDROCODONE-ACETAMINOPHEN 5-325 MG PO TABS: 1.0000 | ORAL_TABLET | Freq: Every evening | ORAL | 0 refills | Status: DC | PRN

## 2019-06-01 MED ORDER — DILTIAZEM HCL ER COATED BEADS 360 MG PO CP24: ORAL_CAPSULE | ORAL | 1 refills | Status: DC

## 2019-06-01 MED ORDER — VALSARTAN-HYDROCHLOROTHIAZIDE 320-12.5 MG PO TABS: 1.0000 | ORAL_TABLET | Freq: Every morning | ORAL | 1 refills | Status: DC

## 2019-06-01 MED ORDER — HYDROCODONE-ACETAMINOPHEN 5-325 MG PO TABS: 1.0000 | ORAL_TABLET | Freq: Every day | ORAL | 0 refills | Status: DC

## 2019-06-01 NOTE — Progress Notes (Signed)
Subjective:    Patient ID: Philip Shepard, male    DOB: 11-12-1976, 43 y.o.   MRN: [id number]   HPI: Philip Shepard is a 43 y.o. male presenting for recheck of pain. Increased work load due to [employer] related layoffs This had led to increase in pain, but he is unwilling to request further medication. He would lie refills though of the current regimen. It does allow enough relief for him to get some sleep when taken at bedtime. He is able to do the functions of his job and his ADLs. His MME is 5.   He also has chronic anxiety with frequent worry, restlessness, ruminating over problems that may or may not exist. He has a since of something ominous about to happen on occasionally.   Depression screen [facility] 2/9 12/16/2018 12/16/2018 09/24/2018 07/06/2018 06/18/2018  Decreased Interest 0 0 0 0 0  Down, Depressed, Hopeless 0 0 0 0 0  PHQ - 2 Score 0 0 0 0 0  Altered sleeping - - - - -  Tired, decreased energy - - - - -  Change in appetite - - - - -  Feeling bad or failure about yourself  - - - - -  Trouble concentrating - - - - -  Moving slowly or fidgety/restless - - - - -  Suicidal thoughts - - - - -  PHQ-9 Score - - - - -     Relevant past medical, surgical, family and social history reviewed and updated as indicated.  Interim medical history since our last visit reviewed. Allergies and medications reviewed and updated.  ROS:  Review of Systems  Constitutional: Negative.   HENT: Negative.   Eyes: Negative for visual disturbance.  Respiratory: Negative for cough and shortness of breath.   Cardiovascular: Negative for chest pain and leg swelling.  Gastrointestinal: Negative for abdominal pain, diarrhea, nausea and vomiting.  Genitourinary: Negative for difficulty urinating.  Musculoskeletal: Positive for arthralgias, back pain and myalgias.  Skin: Negative for rash.  Neurological: Negative for headaches.  Psychiatric/Behavioral: Positive for dysphoric mood and sleep disturbance. The  patient is nervous/anxious.      Social History   Tobacco Use  Smoking Status Current Every Day Smoker  . Types: Cigarettes  Smokeless Tobacco Former Systems developer  . Quit date: 09/04/2016       Objective:     Wt Readings from Last 3 Encounters:  12/16/18 232 lb 3.2 oz (105.3 kg)  09/24/18 227 lb (103 kg)  07/06/18 221 lb (100.2 kg)     Exam deferred. Pt. Harboring due to COVID 19. Phone visit performed.   Assessment & Plan:   1. HNP (herniated nucleus pulposus), lumbar   2. Generalized anxiety disorder   3. Chronic left-sided low back pain with left-sided sciatica     Meds ordered this encounter  Medications  . allopurinol (ZYLOPRIM) 100 MG tablet    Sig: Take 1 tablet (100 mg total) by mouth daily.    Dispense:  90 tablet    Refill:  1  . clonazePAM (KLONOPIN) 1 MG tablet    Sig: TAKE 1 AND 1/2 TABLETS DAILY BY MOUTH AT BEDTIME    Dispense:  45 tablet    Refill:  2    Not to exceed 3 additional fills before 06/16/2018.  Marland Kitchen HYDROcodone-acetaminophen (NORCO) 5-325 MG tablet    Sig: Take 1 tablet by mouth at bedtime as needed for moderate pain. Take for moderate to severe pain    Dispense:  30 tablet    Refill:  0  . HYDROcodone-acetaminophen (NORCO) 5-325 MG tablet    Sig: Take 1 tablet by mouth daily as needed for moderate pain.    Dispense:  30 tablet    Refill:  0  . valsartan-hydrochlorothiazide (DIOVAN-HCT) 320-12.5 MG tablet    Sig: Take 1 tablet by mouth every morning.    Dispense:  90 tablet    Refill:  1  . HYDROcodone-acetaminophen (NORCO) 5-325 MG tablet    Sig: Take 1 tablet by mouth at bedtime. Take for moderate to severe pain    Dispense:  30 tablet    Refill:  0  . diltiazem (CARDIZEM CD) 360 MG 24 hr capsule    Sig: TAKE 1 CAPSULE (360 MG TOTAL) BY MOUTH DAILY.    Dispense:  90 capsule    Refill:  1    No orders of the defined types were placed in this encounter.     Diagnoses and all orders for this visit:  HNP (herniated nucleus pulposus),  lumbar  Generalized anxiety disorder  Chronic left-sided low back pain with left-sided sciatica  Other orders -     allopurinol (ZYLOPRIM) 100 MG tablet; Take 1 tablet (100 mg total) by mouth daily. -     clonazePAM (KLONOPIN) 1 MG tablet; TAKE 1 AND 1/2 TABLETS DAILY BY MOUTH AT BEDTIME -     HYDROcodone-acetaminophen (NORCO) 5-325 MG tablet; Take 1 tablet by mouth at bedtime as needed for moderate pain. Take for moderate to severe pain -     HYDROcodone-acetaminophen (NORCO) 5-325 MG tablet; Take 1 tablet by mouth daily as needed for moderate pain. -     valsartan-hydrochlorothiazide (DIOVAN-HCT) 320-12.5 MG tablet; Take 1 tablet by mouth every morning. -     HYDROcodone-acetaminophen (NORCO) 5-325 MG tablet; Take 1 tablet by mouth at bedtime. Take for moderate to severe pain -     diltiazem (CARDIZEM CD) 360 MG 24 hr capsule; TAKE 1 CAPSULE (360 MG TOTAL) BY MOUTH DAILY.    Virtual Visit via telephone Note  I discussed the limitations, risks, security and privacy concerns of performing an evaluation and management service by telephone and the availability of in person appointments. The patient was identified with two identifiers. Pt.expressed understanding and agreed to proceed. Pt. Is at home. Dr. Darlyn ReadStacks is in his office.  Follow Up Instructions:   I discussed the assessment and treatment plan with the patient. The patient was provided an opportunity to ask questions and all were answered. The patient agreed with the plan and demonstrated an understanding of the instructions.   The patient was advised to call back or seek an in-person evaluation if the symptoms worsen or if the condition fails to improve as anticipated.   Total minutes including chart review and phone contact time: 29   Follow up plan: No follow-ups on file.  Mechele ClaudeWarren Oralee Rapaport, MD [facility]

## 2019-06-10 ENCOUNTER — Encounter: Payer: Self-pay | Admitting: Family Medicine

## 2019-07-21 DIAGNOSIS — G4733 Obstructive sleep apnea (adult) (pediatric): Secondary | ICD-10-CM | POA: Diagnosis not present

## 2019-09-05 ENCOUNTER — Ambulatory Visit (INDEPENDENT_AMBULATORY_CARE_PROVIDER_SITE_OTHER): Payer: BC Managed Care – PPO | Admitting: Family Medicine

## 2019-09-05 ENCOUNTER — Encounter: Payer: Self-pay | Admitting: Family Medicine

## 2019-09-05 DIAGNOSIS — M5126 Other intervertebral disc displacement, lumbar region: Secondary | ICD-10-CM

## 2019-09-05 DIAGNOSIS — I10 Essential (primary) hypertension: Secondary | ICD-10-CM

## 2019-09-05 DIAGNOSIS — F411 Generalized anxiety disorder: Secondary | ICD-10-CM

## 2019-09-05 MED ORDER — HYDROCODONE-ACETAMINOPHEN 5-325 MG PO TABS: 1.0000 | ORAL_TABLET | Freq: Every day | ORAL | 0 refills | Status: DC

## 2019-09-05 MED ORDER — HYDROCODONE-ACETAMINOPHEN 5-325 MG PO TABS: 1.0000 | ORAL_TABLET | Freq: Every day | ORAL | 0 refills | Status: DC | PRN

## 2019-09-05 MED ORDER — HYDROCODONE-ACETAMINOPHEN 5-325 MG PO TABS: 1.0000 | ORAL_TABLET | Freq: Every evening | ORAL | 0 refills | Status: DC | PRN

## 2019-09-05 MED ORDER — CLONAZEPAM 1 MG PO TABS: ORAL_TABLET | ORAL | 2 refills | Status: DC

## 2019-09-05 NOTE — Progress Notes (Signed)
Subjective:    Patient ID: Philip Shepard, male    DOB: 14-Sep-1976, 43 y.o.   MRN: [id number]   HPI: Philip Shepard is a 43 y.o. male presenting for pain treatment. About the same as usual. 7/10 back pain. Pain pill helps with pain and allows for sleep. Taking it about 5 times a week. 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 Not as bad if he stays standing and keeps moving. He has a new truck and the seat is better, so he gets relief with sitting in his truck.   presents for  follow-up of hypertension. Patient has no history of headache chest pain or shortness of breath or recent cough. Patient also denies symptoms of TIA such as focal numbness or weakness. Patient denies side effects from medication. States taking it regularly.     Depression screen [facility] 2/9 12/16/2018 12/16/2018 09/24/2018 07/06/2018 06/18/2018  Decreased Interest 0 0 0 0 0  Down, Depressed, Hopeless 0 0 0 0 0  PHQ - 2 Score 0 0 0 0 0  Altered sleeping - - - - -  Tired, decreased energy - - - - -  Change in appetite - - - - -  Feeling bad or failure about yourself  - - - - -  Trouble concentrating - - - - -  Moving slowly or fidgety/restless - - - - -  Suicidal thoughts - - - - -  PHQ-9 Score - - - - -     Relevant past medical, surgical, family and social history reviewed and updated as indicated.  Interim medical history since our last visit reviewed. Allergies and medications reviewed and updated.  ROS:  Review of Systems  Constitutional: Negative.  Negative for fever.  HENT: Negative.   Eyes: Negative for visual disturbance.  Respiratory: Negative for cough and shortness of breath.   Cardiovascular: Negative for chest pain and leg swelling.  Gastrointestinal: Negative for abdominal pain, diarrhea, nausea and vomiting.  Genitourinary: Negative for difficulty urinating.  Musculoskeletal: Positive for arthralgias and back pain. Negative for myalgias.  Skin: Negative for rash.  Neurological: Negative for headaches.   Psychiatric/Behavioral: Negative for sleep disturbance. The patient is nervous/anxious.      Social History   Tobacco Use  Smoking Status Current Every Day Smoker  . Types: Cigarettes  Smokeless Tobacco Former Systems developer  . Quit date: 09/04/2016       Objective:     Wt Readings from Last 3 Encounters:  12/16/18 232 lb 3.2 oz (105.3 kg)  09/24/18 227 lb (103 kg)  07/06/18 221 lb (100.2 kg)     Exam deferred. Pt. Harboring due to COVID 19. Phone visit performed.   Assessment & Plan:   1. HNP (herniated nucleus pulposus), lumbar   2. Generalized anxiety disorder   3. Essential hypertension     Meds ordered this encounter  Medications  . HYDROcodone-acetaminophen (NORCO) 5-325 MG tablet    Sig: Take 1 tablet by mouth at bedtime as needed for moderate pain. Take for moderate to severe pain    Dispense:  30 tablet    Refill:  0  . HYDROcodone-acetaminophen (NORCO) 5-325 MG tablet    Sig: Take 1 tablet by mouth daily as needed for moderate pain.    Dispense:  30 tablet    Refill:  0  . HYDROcodone-acetaminophen (NORCO) 5-325 MG tablet    Sig: Take 1 tablet by mouth at bedtime. Take for moderate to severe pain    Dispense:  30  tablet    Refill:  0  . clonazePAM (KLONOPIN) 1 MG tablet    Sig: TAKE 1 AND 1/2 TABLETS DAILY BY MOUTH AT BEDTIME    Dispense:  45 tablet    Refill:  2    Not to exceed 3 additional fills before 06/16/2018.    No orders of the defined types were placed in this encounter.     Diagnoses and all orders for this visit:  HNP (herniated nucleus pulposus), lumbar  Generalized anxiety disorder  Essential hypertension  Other orders -     HYDROcodone-acetaminophen (NORCO) 5-325 MG tablet; Take 1 tablet by mouth at bedtime as needed for moderate pain. Take for moderate to severe pain -     HYDROcodone-acetaminophen (NORCO) 5-325 MG tablet; Take 1 tablet by mouth daily as needed for moderate pain. -     HYDROcodone-acetaminophen (NORCO) 5-325 MG  tablet; Take 1 tablet by mouth at bedtime. Take for moderate to severe pain -     clonazePAM (KLONOPIN) 1 MG tablet; TAKE 1 AND 1/2 TABLETS DAILY BY MOUTH AT BEDTIME    Virtual Visit via telephone Note  I discussed the limitations, risks, security and privacy concerns of performing an evaluation and management service by telephone and the availability of in person appointments. The patient was identified with two identifiers. Pt.expressed understanding and agreed to proceed. Pt. Is at home. Dr. Darlyn Read is in his office.  Follow Up Instructions:   I discussed the assessment and treatment plan with the patient. The patient was provided an opportunity to ask questions and all were answered. The patient agreed with the plan and demonstrated an understanding of the instructions.   The patient was advised to call back or seek an in-person evaluation if the symptoms worsen or if the condition fails to improve as anticipated.   Total minutes including chart review and phone contact time: 25   Follow up plan: Return in about 3 months (around 12/06/2019).  Mechele Claude, MD [facility]

## 2019-09-27 DIAGNOSIS — R030 Elevated blood-pressure reading, without diagnosis of hypertension: Secondary | ICD-10-CM | POA: Diagnosis not present

## 2019-09-27 DIAGNOSIS — Z20828 Contact with and (suspected) exposure to other viral communicable diseases: Secondary | ICD-10-CM | POA: Diagnosis not present

## 2019-10-26 ENCOUNTER — Encounter: Payer: Self-pay | Admitting: Family Medicine

## 2019-10-26 ENCOUNTER — Ambulatory Visit (INDEPENDENT_AMBULATORY_CARE_PROVIDER_SITE_OTHER): Payer: BC Managed Care – PPO | Admitting: Family Medicine

## 2019-10-26 ENCOUNTER — Telehealth: Payer: Self-pay | Admitting: Family Medicine

## 2019-10-26 DIAGNOSIS — H00014 Hordeolum externum left upper eyelid: Secondary | ICD-10-CM

## 2019-10-26 NOTE — Progress Notes (Signed)
Virtual Visit via Telephone Note  I connected with Philip Shepard on 10/26/19 at 4:59 PM by telephone and verified that I am speaking with the correct person using two identifiers. Philip Shepard is currently located at home and his wife is currently with him during this visit, which he is okay with. The provider, Gwenlyn Fudge, FNP is located in their office at time of visit.  I discussed the limitations, risks, security and privacy concerns of performing an evaluation and management service by telephone and the availability of in person appointments. I also discussed with the patient that there may be a patient responsible charge related to this service. The patient expressed understanding and agreed to proceed.  Subjective: PCP: Mechele Claude, MD  Chief Complaint  Patient presents with  . Stye   Patient reports about a week ago his entire left upper eyelid swelled.  He was given antibiotic ointment by his son's pediatrician which reduced the swelling of his eyelid.  He now has a pea-sized stye in the middle of his upper eyelid.  He has been applying warm compresses periodically.  Reports Saturday night he did have some drainage that was white in color come out followed by serosanguineous drainage, but has had none since.   ROS: Per HPI  Current Outpatient Medications:  .  allopurinol (ZYLOPRIM) 100 MG tablet, Take 1 tablet (100 mg total) by mouth daily., Disp: 90 tablet, Rfl: 1 .  clonazePAM (KLONOPIN) 1 MG tablet, TAKE 1 AND 1/2 TABLETS DAILY BY MOUTH AT BEDTIME, Disp: 45 tablet, Rfl: 2 .  diltiazem (CARDIZEM CD) 360 MG 24 hr capsule, TAKE 1 CAPSULE (360 MG TOTAL) BY MOUTH DAILY., Disp: 90 capsule, Rfl: 1 .  HYDROcodone-acetaminophen (NORCO) 5-325 MG tablet, Take 1 tablet by mouth at bedtime as needed for moderate pain. Take for moderate to severe pain, Disp: 30 tablet, Rfl: 0 .  HYDROcodone-acetaminophen (NORCO) 5-325 MG tablet, Take 1 tablet by mouth daily as needed for moderate pain.,  Disp: 30 tablet, Rfl: 0 .  HYDROcodone-acetaminophen (NORCO) 5-325 MG tablet, Take 1 tablet by mouth at bedtime. Take for moderate to severe pain, Disp: 30 tablet, Rfl: 0 .  valsartan-hydrochlorothiazide (DIOVAN-HCT) 320-12.5 MG tablet, Take 1 tablet by mouth every morning., Disp: 90 tablet, Rfl: 1  Allergies  Allergen Reactions  . Penicillins   . Prednisone     Rage, allergic reaction   Past Medical History:  Diagnosis Date  . Anxiety   . Hypertension   . Sleep disorder     Observations/Objective: A&O  No respiratory distress or wheezing audible over the phone Mood, judgement, and thought processes all WNL  Assessment and Plan: 1. Hordeolum externum of left upper eyelid - Education provided on styes.  Patient to apply warm compresses for 10 to 15 minutes 4 times daily followed by gentle massage.  Cleanse eye with Johnson's baby soap twice daily.  Encouraged to go see ophthalmologist that he is established with if stye persist beyond 2 weeks with the above treatment.   Follow Up Instructions:  I discussed the assessment and treatment plan with the patient. The patient was provided an opportunity to ask questions and all were answered. The patient agreed with the plan and demonstrated an understanding of the instructions.   The patient was advised to call back or.  Seek an in-person evaluation if the symptoms worsen or if the condition fails to improve as anticipated.  The above assessment and management plan was discussed with the patient. The patient  verbalized understanding of and has agreed to the management plan. Patient is aware to call the clinic if symptoms persist or worsen. Patient is aware when to return to the clinic for a follow-up visit. Patient educated on when it is appropriate to go to the emergency department.   Time call ended: 5:09 PM  I provided 15 minutes of non-face-to-face time during this encounter.  Hendricks Limes, MSN, APRN, FNP-C [facility] 10/26/19

## 2019-10-26 NOTE — Telephone Encounter (Signed)
Appointment scheduled for tele visit on 10/26/2019 at 6:00 pm.

## 2019-11-16 DIAGNOSIS — G4733 Obstructive sleep apnea (adult) (pediatric): Secondary | ICD-10-CM | POA: Diagnosis not present

## 2019-12-05 ENCOUNTER — Ambulatory Visit (INDEPENDENT_AMBULATORY_CARE_PROVIDER_SITE_OTHER): Payer: BC Managed Care – PPO | Admitting: Family Medicine

## 2019-12-05 ENCOUNTER — Encounter: Payer: Self-pay | Admitting: Family Medicine

## 2019-12-05 DIAGNOSIS — L732 Hidradenitis suppurativa: Secondary | ICD-10-CM | POA: Diagnosis not present

## 2019-12-05 DIAGNOSIS — F411 Generalized anxiety disorder: Secondary | ICD-10-CM

## 2019-12-05 DIAGNOSIS — I10 Essential (primary) hypertension: Secondary | ICD-10-CM

## 2019-12-05 DIAGNOSIS — M5126 Other intervertebral disc displacement, lumbar region: Secondary | ICD-10-CM | POA: Diagnosis not present

## 2019-12-05 MED ORDER — VALSARTAN-HYDROCHLOROTHIAZIDE 320-12.5 MG PO TABS: 1.0000 | ORAL_TABLET | Freq: Every morning | ORAL | 1 refills | Status: DC

## 2019-12-05 MED ORDER — CLONAZEPAM 1 MG PO TABS: ORAL_TABLET | ORAL | 2 refills | Status: DC

## 2019-12-05 MED ORDER — HYDROCODONE-ACETAMINOPHEN 5-325 MG PO TABS: 1.0000 | ORAL_TABLET | Freq: Every day | ORAL | 0 refills | Status: DC | PRN

## 2019-12-05 MED ORDER — HYDROCODONE-ACETAMINOPHEN 5-325 MG PO TABS: 1.0000 | ORAL_TABLET | Freq: Every evening | ORAL | 0 refills | Status: DC | PRN

## 2019-12-05 MED ORDER — HYDROCODONE-ACETAMINOPHEN 5-325 MG PO TABS: 1.0000 | ORAL_TABLET | Freq: Every day | ORAL | 0 refills | Status: DC

## 2019-12-05 MED ORDER — MINOCYCLINE HCL 100 MG PO CAPS: 100.0000 mg | ORAL_CAPSULE | Freq: Every day | ORAL | 1 refills | Status: AC

## 2019-12-05 NOTE — Progress Notes (Signed)
Subjective:    Patient ID: Philip Shepard, male    DOB: 16-Sep-1976, 44 y.o.   MRN: [id number]   HPI: Philip Shepard is a 44 y.o. male presenting for armpits broken out and sty came up last month. A few have popped. Don't hurt like the MRSA used to, but won go away. Axillary  Lesions are sore,  Has not been on any antibiotic. Stressed by [employer] exposure. Tested twice and negative. Perturbed about virus. Perturbed about politics in the country. Aggravated. Masks are causing problems. Causing BP to go up.   Bottom left back pain. Gets relief by walking it out. Taking one hydrocodone at hs if he can't get rid of it that way. Pain at work is pretty high. 7/10 pain. Miserable. Has dealt with it so long he is able to put up with it. He says he can squat for a while and decrease the pain. Still working .      Depression screen [facility] 2/9 12/16/2018 12/16/2018 09/24/2018 07/06/2018 06/18/2018  Decreased Interest 0 0 0 0 0  Down, Depressed, Hopeless 0 0 0 0 0  PHQ - 2 Score 0 0 0 0 0  Altered sleeping - - - - -  Tired, decreased energy - - - - -  Change in appetite - - - - -  Feeling bad or failure about yourself  - - - - -  Trouble concentrating - - - - -  Moving slowly or fidgety/restless - - - - -  Suicidal thoughts - - - - -  PHQ-9 Score - - - - -     Relevant past medical, surgical, family and social history reviewed and updated as indicated.  Interim medical history since our last visit reviewed. Allergies and medications reviewed and updated.  ROS:  Review of Systems  Constitutional: Negative.   HENT: Negative.   Eyes: Negative for visual disturbance.  Respiratory: Negative for cough and shortness of breath.   Cardiovascular: Negative for chest pain and leg swelling.  Gastrointestinal: Negative for abdominal pain, diarrhea, nausea and vomiting.  Genitourinary: Negative for difficulty urinating.  Musculoskeletal: Positive for back pain. Negative for arthralgias and myalgias.  Skin:  Negative for rash.  Neurological: Negative for headaches.  Psychiatric/Behavioral: Negative for sleep disturbance.     Social History   Tobacco Use  Smoking Status Current Every Day Smoker  . Types: Cigarettes  Smokeless Tobacco Former Neurosurgeon  . Quit date: 09/04/2016       Objective:     Wt Readings from Last 3 Encounters:  12/16/18 232 lb 3.2 oz (105.3 kg)  09/24/18 227 lb (103 kg)  07/06/18 221 lb (100.2 kg)     Exam deferred. Pt. Harboring due to COVID 19. Phone visit performed.   Assessment & Plan:   1. Essential hypertension   2. Generalized anxiety disorder   3. HNP (herniated nucleus pulposus), lumbar   4. Hydradenitis     Meds ordered this encounter  Medications  . minocycline (MINOCIN) 100 MG capsule    Sig: Take 1 capsule (100 mg total) by mouth daily. Take on an empty stomach    Dispense:  90 capsule    Refill:  1  . valsartan-hydrochlorothiazide (DIOVAN-HCT) 320-12.5 MG tablet    Sig: Take 1 tablet by mouth every morning.    Dispense:  90 tablet    Refill:  1  . HYDROcodone-acetaminophen (NORCO) 5-325 MG tablet    Sig: Take 1 tablet by mouth at bedtime. Take for  moderate to severe pain    Dispense:  30 tablet    Refill:  0  . HYDROcodone-acetaminophen (NORCO) 5-325 MG tablet    Sig: Take 1 tablet by mouth at bedtime as needed for moderate pain. Take for moderate to severe pain    Dispense:  30 tablet    Refill:  0  . HYDROcodone-acetaminophen (NORCO) 5-325 MG tablet    Sig: Take 1 tablet by mouth daily as needed for moderate pain.    Dispense:  30 tablet    Refill:  0  . clonazePAM (KLONOPIN) 1 MG tablet    Sig: TAKE 1 AND 1/2 TABLETS DAILY BY MOUTH AT BEDTIME    Dispense:  45 tablet    Refill:  2    No orders of the defined types were placed in this encounter.     Diagnoses and all orders for this visit:  Essential hypertension -     valsartan-hydrochlorothiazide (DIOVAN-HCT) 320-12.5 MG tablet; Take 1 tablet by mouth every  morning.  Generalized anxiety disorder -     clonazePAM (KLONOPIN) 1 MG tablet; TAKE 1 AND 1/2 TABLETS DAILY BY MOUTH AT BEDTIME  HNP (herniated nucleus pulposus), lumbar -     HYDROcodone-acetaminophen (NORCO) 5-325 MG tablet; Take 1 tablet by mouth at bedtime. Take for moderate to severe pain -     HYDROcodone-acetaminophen (NORCO) 5-325 MG tablet; Take 1 tablet by mouth at bedtime as needed for moderate pain. Take for moderate to severe pain -     HYDROcodone-acetaminophen (NORCO) 5-325 MG tablet; Take 1 tablet by mouth daily as needed for moderate pain.  Hydradenitis -     minocycline (MINOCIN) 100 MG capsule; Take 1 capsule (100 mg total) by mouth daily. Take on an empty stomach  Pt. Continues to have significant back pain due to herniated disk. Worries that his faher has just had his fourth back surgery last week. Pt. Wants to avoid going down that path. He is trying to limit his use of hydrocodone and his PDMP report is good  However he deals with a great deal of anxiety. He is having a great deal of frustration with wearing a mask at work. I attempted to reassure him. Try taking breaks away from others in areas that would be feasible for going without a mask.Clonazepam takes off the edge at night. I am concerned about concomitant use of the two types of med, but pt is motivated to use them minimally for clear indications.   Pt. Has hydradenitis history. Several years ago he had so many infections that I lanced for him that he had to go on zyvox as the only antibiotic to which  his strain was susceptible.Minocin was a standby for him at that time as well.   Virtual Visit via telephone Note  I discussed the limitations, risks, security and privacy concerns of performing an evaluation and management service by telephone and the availability of in person appointments. The patient was identified with two identifiers. Pt.expressed understanding and agreed to proceed. Pt. Is at home. His wife  is with him. Dr. Livia Snellen is in his office.  Follow Up Instructions:   I discussed the assessment and treatment plan with the patient. The patient was provided an opportunity to ask questions and all were answered. The patient agreed with the plan and demonstrated an understanding of the instructions.   The patient was advised to call back or seek an in-person evaluation if the symptoms worsen or if the condition fails to  improve as anticipated.   Total minutes including chart review and phone contact time: 37   Follow up plan: Return in about 3 months (around 03/04/2020).  Mechele Claude, MD [facility]

## 2020-03-05 ENCOUNTER — Encounter: Payer: Self-pay | Admitting: Family Medicine

## 2020-03-05 ENCOUNTER — Ambulatory Visit (INDEPENDENT_AMBULATORY_CARE_PROVIDER_SITE_OTHER): Payer: BC Managed Care – PPO | Admitting: Family Medicine

## 2020-03-05 DIAGNOSIS — F411 Generalized anxiety disorder: Secondary | ICD-10-CM | POA: Diagnosis not present

## 2020-03-05 DIAGNOSIS — M5126 Other intervertebral disc displacement, lumbar region: Secondary | ICD-10-CM

## 2020-03-05 MED ORDER — HYDROCODONE-ACETAMINOPHEN 5-325 MG PO TABS: 1.0000 | ORAL_TABLET | Freq: Every evening | ORAL | 0 refills | Status: DC | PRN

## 2020-03-05 MED ORDER — HYDROCODONE-ACETAMINOPHEN 5-325 MG PO TABS: 1.0000 | ORAL_TABLET | Freq: Every day | ORAL | 0 refills | Status: DC | PRN

## 2020-03-05 MED ORDER — HYDROCODONE-ACETAMINOPHEN 5-325 MG PO TABS: 1.0000 | ORAL_TABLET | Freq: Every day | ORAL | 0 refills | Status: DC

## 2020-03-05 MED ORDER — CLONAZEPAM 1 MG PO TABS: ORAL_TABLET | ORAL | 2 refills | Status: DC

## 2020-03-05 NOTE — Progress Notes (Signed)
Subjective:    Patient ID: Philip Shepard, male    DOB: 10/29/1976, 44 y.o.   MRN: [id number]   HPI: Philip Shepard is a 44 y.o. male presenting for stress related to family situation - mom loopy from meds used for her foot eschar. Can't get time off for anything.   Taking pain pill about every night. Having to lift his mom. Hasn't exceeded one a day. PDMP shows no discrepancy. Still having a  Lot of back pain. Pain is 3-4/10Lower back with radiation to LLE  .   Depression screen [facility] 2/9 12/16/2018 12/16/2018 09/24/2018 07/06/2018 06/18/2018  Decreased Interest 0 0 0 0 0  Down, Depressed, Hopeless 0 0 0 0 0  PHQ - 2 Score 0 0 0 0 0  Altered sleeping - - - - -  Tired, decreased energy - - - - -  Change in appetite - - - - -  Feeling bad or failure about yourself  - - - - -  Trouble concentrating - - - - -  Moving slowly or fidgety/restless - - - - -  Suicidal thoughts - - - - -  PHQ-9 Score - - - - -     Relevant past medical, surgical, family and social history reviewed and updated as indicated.  Interim medical history since our last visit reviewed. Allergies and medications reviewed and updated.  ROS:  Review of Systems  Constitutional: Negative for fever.  Respiratory: Negative for shortness of breath.   Cardiovascular: Negative for chest pain.  Musculoskeletal: Negative for arthralgias.  Skin: Negative for rash.     Social History   Tobacco Use  Smoking Status Current Every Day Smoker  . Types: Cigarettes  Smokeless Tobacco Former Neurosurgeon  . Quit date: 09/04/2016       Objective:     Wt Readings from Last 3 Encounters:  12/16/18 232 lb 3.2 oz (105.3 kg)  09/24/18 227 lb (103 kg)  07/06/18 221 lb (100.2 kg)     Exam deferred. Pt. Harboring due to COVID 19. Phone visit performed.   Assessment & Plan:   1. HNP (herniated nucleus pulposus), lumbar   2. Generalized anxiety disorder     Meds ordered this encounter  Medications  . HYDROcodone-acetaminophen  (NORCO) 5-325 MG tablet    Sig: Take 1 tablet by mouth at bedtime as needed for moderate pain. Take for moderate to severe pain    Dispense:  30 tablet    Refill:  0  . HYDROcodone-acetaminophen (NORCO) 5-325 MG tablet    Sig: Take 1 tablet by mouth at bedtime. Take for moderate to severe pain    Dispense:  30 tablet    Refill:  0  . HYDROcodone-acetaminophen (NORCO) 5-325 MG tablet    Sig: Take 1 tablet by mouth daily as needed for moderate pain.    Dispense:  30 tablet    Refill:  0  . clonazePAM (KLONOPIN) 1 MG tablet    Sig: TAKE 1 AND 1/2 TABLETS DAILY BY MOUTH AT BEDTIME    Dispense:  45 tablet    Refill:  2    No orders of the defined types were placed in this encounter.     Diagnoses and all orders for this visit:  HNP (herniated nucleus pulposus), lumbar -     HYDROcodone-acetaminophen (NORCO) 5-325 MG tablet; Take 1 tablet by mouth at bedtime as needed for moderate pain. Take for moderate to severe pain -     HYDROcodone-acetaminophen (NORCO)  5-325 MG tablet; Take 1 tablet by mouth at bedtime. Take for moderate to severe pain -     HYDROcodone-acetaminophen (NORCO) 5-325 MG tablet; Take 1 tablet by mouth daily as needed for moderate pain.  Generalized anxiety disorder -     clonazePAM (KLONOPIN) 1 MG tablet; TAKE 1 AND 1/2 TABLETS DAILY BY MOUTH AT BEDTIME    Virtual Visit via telephone Note  I discussed the limitations, risks, security and privacy concerns of performing an evaluation and management service by telephone and the availability of in person appointments. The patient was identified with two identifiers. Pt.expressed understanding and agreed to proceed. Pt. Is at home. Dr. Livia Snellen is in his office.  Follow Up Instructions:   I discussed the assessment and treatment plan with the patient. The patient was provided an opportunity to ask questions and all were answered. The patient agreed with the plan and demonstrated an understanding of the instructions.     The patient was advised to call back or seek an in-person evaluation if the symptoms worsen or if the condition fails to improve as anticipated.   Total minutes including chart review and phone contact time: 14   Follow up plan: No follow-ups on file.  Philip Fraise, MD [place]

## 2020-03-14 ENCOUNTER — Telehealth: Payer: Self-pay | Admitting: Family Medicine

## 2020-03-14 DIAGNOSIS — M545 Low back pain: Secondary | ICD-10-CM | POA: Diagnosis not present

## 2020-03-14 NOTE — Telephone Encounter (Signed)
Please contact the patient . Rest at home. I can send in prednisone if desired.

## 2020-03-14 NOTE — Telephone Encounter (Signed)
Note on chart has patient dismissed.  Please review and advise. Thanks

## 2020-03-14 NOTE — Telephone Encounter (Signed)
  Incoming Patient Call  03/14/2020  What symptoms do you have? 3 herniated discs  How long have you been sick? He has had this for about three years, he does not know what has been going on from Monday to know the pain is excruciating when he stands up making his hip hurt. Today is the second day he has to leave work due to the pain   Have you been seen for this problem? Yes he has discussed this with Dr. Darlyn Read was send to get MRI and was told he had herniated discs  If your provider decides to give you a prescription, which pharmacy would you like for it to be sent to? [facility], he does not need medication called in needs to know what to do    Patient informed that this information will be sent to the clinical staff for review and that they should receive a follow up call.

## 2020-03-14 NOTE — Telephone Encounter (Signed)
He went to [facility] and they are helping.

## 2020-03-21 DIAGNOSIS — M545 Low back pain: Secondary | ICD-10-CM | POA: Diagnosis not present

## 2020-04-04 ENCOUNTER — Other Ambulatory Visit: Payer: Self-pay | Admitting: Family Medicine

## 2020-04-25 DIAGNOSIS — G4733 Obstructive sleep apnea (adult) (pediatric): Secondary | ICD-10-CM | POA: Diagnosis not present

## 2020-05-01 ENCOUNTER — Other Ambulatory Visit: Payer: Self-pay | Admitting: Family Medicine

## 2020-05-01 DIAGNOSIS — L732 Hidradenitis suppurativa: Secondary | ICD-10-CM

## 2020-05-01 MED ORDER — DILTIAZEM HCL ER COATED BEADS 360 MG PO CP24: ORAL_CAPSULE | ORAL | 0 refills | Status: DC

## 2020-05-01 NOTE — Telephone Encounter (Signed)
Account taken of hold Refill sent to pharmacy

## 2020-05-01 NOTE — Addendum Note (Signed)
Addended by: Julious Payer D on: 05/01/2020 03:47 PM   Modules accepted: Orders

## 2020-05-17 ENCOUNTER — Ambulatory Visit (INDEPENDENT_AMBULATORY_CARE_PROVIDER_SITE_OTHER): Payer: BC Managed Care – PPO | Admitting: Family Medicine

## 2020-05-17 DIAGNOSIS — F411 Generalized anxiety disorder: Secondary | ICD-10-CM

## 2020-05-17 DIAGNOSIS — I10 Essential (primary) hypertension: Secondary | ICD-10-CM | POA: Diagnosis not present

## 2020-05-17 DIAGNOSIS — M5126 Other intervertebral disc displacement, lumbar region: Secondary | ICD-10-CM

## 2020-05-17 MED ORDER — HYDROCODONE-ACETAMINOPHEN 5-325 MG PO TABS: 1.0000 | ORAL_TABLET | Freq: Every day | ORAL | 0 refills | Status: DC

## 2020-05-17 MED ORDER — CLONAZEPAM 1 MG PO TABS: ORAL_TABLET | ORAL | 2 refills | Status: DC

## 2020-05-17 MED ORDER — DILTIAZEM HCL ER COATED BEADS 360 MG PO CP24: ORAL_CAPSULE | ORAL | 0 refills | Status: DC

## 2020-05-17 MED ORDER — HYDROCODONE-ACETAMINOPHEN 5-325 MG PO TABS: 1.0000 | ORAL_TABLET | Freq: Every day | ORAL | 0 refills | Status: DC | PRN

## 2020-05-17 MED ORDER — VALSARTAN-HYDROCHLOROTHIAZIDE 320-12.5 MG PO TABS: 1.0000 | ORAL_TABLET | Freq: Every morning | ORAL | 1 refills | Status: DC

## 2020-05-17 MED ORDER — ALLOPURINOL 100 MG PO TABS: 100.0000 mg | ORAL_TABLET | Freq: Every day | ORAL | 1 refills | Status: DC

## 2020-05-17 MED ORDER — HYDROCODONE-ACETAMINOPHEN 5-325 MG PO TABS: 1.0000 | ORAL_TABLET | Freq: Every evening | ORAL | 0 refills | Status: DC | PRN

## 2020-05-17 NOTE — Progress Notes (Signed)
Subjective:    Patient ID: Philip Shepard, male    DOB: Feb 05, 1976, 44 y.o.   MRN: [id number]   HPI: Philip Shepard is a 44 y.o. male presenting for increased low back pain.  He injured himself at work.  He is going through the [employer] process.  He has been told he may need surgery.  Pain is intractable at this time.  However, he declines to take more pain medicine.  This is out of concern for its potential side effects.  He does, however, ask for refills of his clonazepam.  Those are due soon.  He is going through a great deal of anxiety based on this latest health issue.  It has impacted his ability to work.  He is about to go through epidural steroid injections.  He has been told he may need surgery if that does not resolve the symptoms for him.  I did go ahead and renew his pain meds as is.  He is only taking 1 a day and has been stable on that since his original diagnosis of herniated disc.  His morphine milligram equivalent is 5.  His lorazepam milligram equivalent is 3.  PDMP review shows normal appropriate refills on his medication.  No discrepancies or doctor shopping.   Depression screen [facility] 2/9 12/16/2018 12/16/2018 09/24/2018 07/06/2018 06/18/2018  Decreased Interest 0 0 0 0 0  Down, Depressed, Hopeless 0 0 0 0 0  PHQ - 2 Score 0 0 0 0 0  Altered sleeping - - - - -  Tired, decreased energy - - - - -  Change in appetite - - - - -  Feeling bad or failure about yourself  - - - - -  Trouble concentrating - - - - -  Moving slowly or fidgety/restless - - - - -  Suicidal thoughts - - - - -  PHQ-9 Score - - - - -     Relevant past medical, surgical, family and social history reviewed and updated as indicated.  Interim medical history since our last visit reviewed. Allergies and medications reviewed and updated.  ROS:  Review of Systems  Constitutional: Positive for activity change and fatigue.  Respiratory: Negative for shortness of breath.   Cardiovascular: Negative for chest  pain.  Neurological: Positive for weakness.  Psychiatric/Behavioral: Positive for dysphoric mood and sleep disturbance. Negative for agitation and confusion. The patient is nervous/anxious.      Social History   Tobacco Use  Smoking Status Current Every Day Smoker  . Types: Cigarettes  Smokeless Tobacco Former Systems developer  . Quit date: 09/04/2016       Objective:     Wt Readings from Last 3 Encounters:  12/16/18 232 lb 3.2 oz (105.3 kg)  09/24/18 227 lb (103 kg)  07/06/18 221 lb (100.2 kg)     Exam deferred. Pt. Harboring due to COVID 19. Phone visit performed.   Assessment & Plan:   1. HNP (herniated nucleus pulposus), lumbar   2. Essential hypertension   3. Generalized anxiety disorder     Meds ordered this encounter  Medications  . HYDROcodone-acetaminophen (NORCO) 5-325 MG tablet    Sig: Take 1 tablet by mouth at bedtime. Take for moderate to severe pain    Dispense:  30 tablet    Refill:  0  . HYDROcodone-acetaminophen (NORCO) 5-325 MG tablet    Sig: Take 1 tablet by mouth at bedtime as needed for moderate pain. Take for moderate to severe pain  Dispense:  30 tablet    Refill:  0  . HYDROcodone-acetaminophen (NORCO) 5-325 MG tablet    Sig: Take 1 tablet by mouth daily as needed for moderate pain.    Dispense:  30 tablet    Refill:  0  . valsartan-hydrochlorothiazide (DIOVAN-HCT) 320-12.5 MG tablet    Sig: Take 1 tablet by mouth every morning.    Dispense:  90 tablet    Refill:  1  . diltiazem (CARDIZEM CD) 360 MG 24 hr capsule    Sig: TAKE 1 CAPSULE (360 MG TOTAL) BY MOUTH DAILY.    Dispense:  90 capsule    Refill:  0  . allopurinol (ZYLOPRIM) 100 MG tablet    Sig: Take 1 tablet (100 mg total) by mouth daily.    Dispense:  90 tablet    Refill:  1  . clonazePAM (KLONOPIN) 1 MG tablet    Sig: TAKE 1 AND 1/2 TABLETS DAILY BY MOUTH AT BEDTIME    Dispense:  45 tablet    Refill:  2    No orders of the defined types were placed in this encounter.      Diagnoses and all orders for this visit:  HNP (herniated nucleus pulposus), lumbar -     HYDROcodone-acetaminophen (NORCO) 5-325 MG tablet; Take 1 tablet by mouth at bedtime. Take for moderate to severe pain -     HYDROcodone-acetaminophen (NORCO) 5-325 MG tablet; Take 1 tablet by mouth at bedtime as needed for moderate pain. Take for moderate to severe pain -     HYDROcodone-acetaminophen (NORCO) 5-325 MG tablet; Take 1 tablet by mouth daily as needed for moderate pain.  Essential hypertension -     valsartan-hydrochlorothiazide (DIOVAN-HCT) 320-12.5 MG tablet; Take 1 tablet by mouth every morning.  Generalized anxiety disorder -     clonazePAM (KLONOPIN) 1 MG tablet; TAKE 1 AND 1/2 TABLETS DAILY BY MOUTH AT BEDTIME  Other orders -     diltiazem (CARDIZEM CD) 360 MG 24 hr capsule; TAKE 1 CAPSULE (360 MG TOTAL) BY MOUTH DAILY. -     allopurinol (ZYLOPRIM) 100 MG tablet; Take 1 tablet (100 mg total) by mouth daily.    Virtual Visit via telephone Note  I discussed the limitations, risks, security and privacy concerns of performing an evaluation and management service by telephone and the availability of in person appointments. The patient was identified with two identifiers. Pt.expressed understanding and agreed to proceed. Pt. Is at home. Dr. Darlyn Read is in his office.  Follow Up Instructions:   I discussed the assessment and treatment plan with the patient. The patient was provided an opportunity to ask questions and all were answered. The patient agreed with the plan and demonstrated an understanding of the instructions.   The patient was advised to call back or seek an in-person evaluation if the symptoms worsen or if the condition fails to improve as anticipated.   Total minutes including chart review and phone contact time: 24   Follow up plan: Return in about 3 months (around 08/17/2020).  Mechele Claude, MD [place] [facility]

## 2020-05-20 ENCOUNTER — Encounter: Payer: Self-pay | Admitting: Family Medicine

## 2020-07-05 DIAGNOSIS — M5442 Lumbago with sciatica, left side: Secondary | ICD-10-CM | POA: Diagnosis not present

## 2020-07-06 DIAGNOSIS — M5126 Other intervertebral disc displacement, lumbar region: Secondary | ICD-10-CM | POA: Diagnosis not present

## 2020-07-06 DIAGNOSIS — M4316 Spondylolisthesis, lumbar region: Secondary | ICD-10-CM | POA: Diagnosis not present

## 2020-07-06 DIAGNOSIS — M5136 Other intervertebral disc degeneration, lumbar region: Secondary | ICD-10-CM | POA: Diagnosis not present

## 2020-07-06 DIAGNOSIS — M5416 Radiculopathy, lumbar region: Secondary | ICD-10-CM | POA: Diagnosis not present

## 2020-07-12 DIAGNOSIS — M5136 Other intervertebral disc degeneration, lumbar region: Secondary | ICD-10-CM | POA: Diagnosis not present

## 2020-07-17 DIAGNOSIS — M5416 Radiculopathy, lumbar region: Secondary | ICD-10-CM | POA: Diagnosis not present

## 2020-07-17 DIAGNOSIS — M5417 Radiculopathy, lumbosacral region: Secondary | ICD-10-CM | POA: Diagnosis not present

## 2020-07-17 DIAGNOSIS — M5127 Other intervertebral disc displacement, lumbosacral region: Secondary | ICD-10-CM | POA: Diagnosis not present

## 2020-07-17 DIAGNOSIS — M5117 Intervertebral disc disorders with radiculopathy, lumbosacral region: Secondary | ICD-10-CM | POA: Diagnosis not present

## 2020-07-23 ENCOUNTER — Other Ambulatory Visit: Payer: Self-pay | Admitting: Family Medicine

## 2020-07-24 DIAGNOSIS — G4733 Obstructive sleep apnea (adult) (pediatric): Secondary | ICD-10-CM | POA: Diagnosis not present

## 2020-08-29 ENCOUNTER — Ambulatory Visit (INDEPENDENT_AMBULATORY_CARE_PROVIDER_SITE_OTHER): Payer: BC Managed Care – PPO | Admitting: Family Medicine

## 2020-08-29 ENCOUNTER — Encounter: Payer: Self-pay | Admitting: Family Medicine

## 2020-08-29 ENCOUNTER — Other Ambulatory Visit: Payer: Self-pay

## 2020-08-29 VITALS — BP 130/74 | HR 90 | Temp 98.4°F | Resp 20 | Ht 71.0 in | Wt 236.0 lb

## 2020-08-29 DIAGNOSIS — Z79899 Other long term (current) drug therapy: Secondary | ICD-10-CM

## 2020-08-29 DIAGNOSIS — I10 Essential (primary) hypertension: Secondary | ICD-10-CM | POA: Diagnosis not present

## 2020-08-29 DIAGNOSIS — F411 Generalized anxiety disorder: Secondary | ICD-10-CM

## 2020-08-29 DIAGNOSIS — M792 Neuralgia and neuritis, unspecified: Secondary | ICD-10-CM

## 2020-08-29 DIAGNOSIS — E790 Hyperuricemia without signs of inflammatory arthritis and tophaceous disease: Secondary | ICD-10-CM

## 2020-08-29 MED ORDER — ALLOPURINOL 100 MG PO TABS: 100.0000 mg | ORAL_TABLET | Freq: Every day | ORAL | 1 refills | Status: DC

## 2020-08-29 MED ORDER — BETAMETHASONE SOD PHOS & ACET 6 (3-3) MG/ML IJ SUSP
6.0000 mg | Freq: Once | INTRAMUSCULAR | Status: AC
  Administered 2020-08-29: 6 mg via INTRAMUSCULAR

## 2020-08-29 MED ORDER — VALSARTAN-HYDROCHLOROTHIAZIDE 320-12.5 MG PO TABS: 1.0000 | ORAL_TABLET | Freq: Every morning | ORAL | 1 refills | Status: DC

## 2020-08-29 MED ORDER — CLONAZEPAM 1 MG PO TABS: ORAL_TABLET | ORAL | 2 refills | Status: DC

## 2020-08-29 MED ORDER — DILTIAZEM HCL ER COATED BEADS 360 MG PO CP24: ORAL_CAPSULE | ORAL | 0 refills | Status: DC

## 2020-08-29 NOTE — Progress Notes (Signed)
Subjective:  Patient ID: Philip Shepard, male    DOB: 1976/03/31  Age: 44 y.o. MRN: [id number]  CC: Medication Refill   HPI Philip Shepard presents for Recently had back surgery and has DCed his hydrocodone and voided the prescription I last gave him. However he still relies on the clonazepam for sleep. He is due for UDS & CSA today.   presents for  follow-up of hypertension. Patient has no history of headache chest pain or shortness of breath or recent cough. Patient also denies symptoms of TIA such as focal numbness or weakness. Patient denies side effects from medication. States taking it regularly.   Depression screen [facility] 2/9 08/29/2020 12/16/2018 12/16/2018  Decreased Interest 0 0 0  Down, Depressed, Hopeless 0 0 0  PHQ - 2 Score 0 0 0  Altered sleeping - - -  Tired, decreased energy - - -  Change in appetite - - -  Feeling bad or failure about yourself  - - -  Trouble concentrating - - -  Moving slowly or fidgety/restless - - -  Suicidal thoughts - - -  PHQ-9 Score - - -    History Philip Shepard has a past medical history of Anxiety, Hypertension, and Sleep disorder.   He has a past surgical history that includes Knee surgery (Left); carpel tunnel left and right; Kidney stone surgery; and Tonsillectomy and adenoidectomy.   His family history includes Arthritis in his father and mother; Cancer in his mother and paternal grandmother; Diabetes in his mother.He reports that he has been smoking cigarettes. He quit smokeless tobacco use about 4 years ago. He reports that he does not drink alcohol and does not use drugs.    ROS Review of Systems  Constitutional: Negative for fever.  Respiratory: Negative for shortness of breath.   Cardiovascular: Negative for chest pain.  Musculoskeletal: Negative for arthralgias.  Skin: Negative for rash.  Neurological: Positive for numbness (behind the right ear).  Psychiatric/Behavioral: Positive for sleep disturbance.    Objective:  BP 130/74    Pulse 90   Temp 98.4 F (36.9 C) (Temporal)   Resp 20   Ht 5' 11"  (1.803 m)   Wt 236 lb (107 kg)   SpO2 96%   BMI 32.92 kg/m   BP Readings from Last 3 Encounters:  08/29/20 130/74  01/04/19 135/76  12/16/18 126/72    Wt Readings from Last 3 Encounters:  08/29/20 236 lb (107 kg)  12/16/18 232 lb 3.2 oz (105.3 kg)  09/24/18 227 lb (103 kg)     Physical Exam Vitals reviewed.  Constitutional:      Appearance: He is well-developed.  HENT:     Head: Normocephalic and atraumatic.     Right Ear: External ear normal.     Left Ear: External ear normal.     Mouth/Throat:     Pharynx: No oropharyngeal exudate or posterior oropharyngeal erythema.  Eyes:     Pupils: Pupils are equal, round, and reactive to light.  Cardiovascular:     Rate and Rhythm: Normal rate and regular rhythm.     Heart sounds: No murmur heard.   Pulmonary:     Effort: No respiratory distress.     Breath sounds: Normal breath sounds.  Musculoskeletal:     Cervical back: Normal range of motion and neck supple.  Neurological:     Mental Status: He is alert and oriented to person, place, and time.       Assessment & Plan:   Philip Shepard  was seen today for medication refill.  Diagnoses and all orders for this visit:  Hyperuricemia -     Uric acid  Generalized anxiety disorder -     clonazePAM (KLONOPIN) 1 MG tablet; TAKE 1 AND 1/2 TABLETS DAILY BY MOUTH AT BEDTIME -     CBC with Differential/Platelet -     CMP14+EGFR  Essential hypertension -     valsartan-hydrochlorothiazide (DIOVAN-HCT) 320-12.5 MG tablet; Take 1 tablet by mouth every morning. -     CBC with Differential/Platelet -     CMP14+EGFR -     Lipid panel  Neuralgia involving scalp -     betamethasone acetate-betamethasone sodium phosphate (CELESTONE) injection 6 mg  Controlled substance agreement signed -     ToxASSURE Select 13 (MW), Urine  Other orders -     diltiazem (CARDIZEM CD) 360 MG 24 hr capsule; TAKE 1 CAPSULE BY MOUTH  EVERY DAY -     allopurinol (ZYLOPRIM) 100 MG tablet; Take 1 tablet (100 mg total) by mouth daily.       I have discontinued Loreli Dollar neomycin-bacitracin-polymyxin, HYDROcodone-acetaminophen, HYDROcodone-acetaminophen, and HYDROcodone-acetaminophen. I am also having him maintain his clonazePAM, diltiazem, valsartan-hydrochlorothiazide, and allopurinol. We administered betamethasone acetate-betamethasone sodium phosphate.  Allergies as of 08/29/2020      Reactions   Penicillins    Prednisone    Rage, allergic reaction      Medication List       Accurate as of August 29, 2020 11:59 PM. If you have any questions, ask your nurse or doctor.        STOP taking these medications   HYDROcodone-acetaminophen 5-325 MG tablet Commonly known as: Norco Stopped by: Claretta Fraise, MD   neomycin-bacitracin-polymyxin ophthalmic ointment Commonly known as: NEOSPORIN Stopped by: Claretta Fraise, MD     TAKE these medications   allopurinol 100 MG tablet Commonly known as: ZYLOPRIM Take 1 tablet (100 mg total) by mouth daily.   clonazePAM 1 MG tablet Commonly known as: KLONOPIN TAKE 1 AND 1/2 TABLETS DAILY BY MOUTH AT BEDTIME   diltiazem 360 MG 24 hr capsule Commonly known as: CARDIZEM CD TAKE 1 CAPSULE BY MOUTH EVERY DAY   valsartan-hydrochlorothiazide 320-12.5 MG tablet Commonly known as: DIOVAN-HCT Take 1 tablet by mouth every morning.        Follow-up: Return in about 3 months (around 11/29/2020).  Claretta Fraise, M.D.

## 2020-08-30 LAB — CMP14+EGFR
ALT: 61 IU/L — ABNORMAL HIGH (ref 0–44)
AST: 31 IU/L (ref 0–40)
Albumin/Globulin Ratio: 2.2 (ref 1.2–2.2)
Albumin: 5.2 g/dL — ABNORMAL HIGH (ref 4.0–5.0)
Alkaline Phosphatase: 88 IU/L (ref 44–121)
BUN/Creatinine Ratio: 11 (ref 9–20)
BUN: 11 mg/dL (ref 6–24)
Bilirubin Total: 0.6 mg/dL (ref 0.0–1.2)
CO2: 24 mmol/L (ref 20–29)
Calcium: 10.1 mg/dL (ref 8.7–10.2)
Chloride: 102 mmol/L (ref 96–106)
Creatinine, Ser: 0.98 mg/dL (ref 0.76–1.27)
GFR calc Af Amer: 109 mL/min/{1.73_m2} (ref >59)
GFR calc non Af Amer: 95 mL/min/{1.73_m2} (ref >59)
Globulin, Total: 2.4 g/dL (ref 1.5–4.5)
Glucose: 93 mg/dL (ref 65–99)
Potassium: 4.5 mmol/L (ref 3.5–5.2)
Sodium: 142 mmol/L (ref 134–144)
Total Protein: 7.6 g/dL (ref 6.0–8.5)

## 2020-08-30 LAB — LIPID PANEL
Chol/HDL Ratio: 5.7 ratio — ABNORMAL HIGH (ref 0.0–5.0)
Cholesterol, Total: 216 mg/dL — ABNORMAL HIGH (ref 100–199)
HDL: 38 mg/dL — ABNORMAL LOW (ref >39)
LDL Chol Calc (NIH): 122 mg/dL — ABNORMAL HIGH (ref 0–99)
Triglycerides: 320 mg/dL — ABNORMAL HIGH (ref 0–149)
VLDL Cholesterol Cal: 56 mg/dL — ABNORMAL HIGH (ref 5–40)

## 2020-08-30 LAB — CBC WITH DIFFERENTIAL/PLATELET
Basophils Absolute: 0.1 10*3/uL (ref 0.0–0.2)
Basos: 1 %
EOS (ABSOLUTE): 0.1 10*3/uL (ref 0.0–0.4)
Eos: 1 %
Hematocrit: 49.5 % (ref 37.5–51.0)
Hemoglobin: 17.3 g/dL (ref 13.0–17.7)
Immature Grans (Abs): 0.1 10*3/uL (ref 0.0–0.1)
Immature Granulocytes: 1 %
Lymphocytes Absolute: 2.8 10*3/uL (ref 0.7–3.1)
Lymphs: 32 %
MCH: 32.3 pg (ref 26.6–33.0)
MCHC: 34.9 g/dL (ref 31.5–35.7)
MCV: 92 fL (ref 79–97)
Monocytes Absolute: 0.7 10*3/uL (ref 0.1–0.9)
Monocytes: 8 %
Neutrophils Absolute: 5.2 10*3/uL (ref 1.4–7.0)
Neutrophils: 57 %
Platelets: 258 10*3/uL (ref 150–450)
RBC: 5.36 x10E6/uL (ref 4.14–5.80)
RDW: 13 % (ref 11.6–15.4)
WBC: 8.9 10*3/uL (ref 3.4–10.8)

## 2020-08-30 LAB — URIC ACID: Uric Acid: 6.6 mg/dL (ref 3.8–8.4)

## 2020-09-01 LAB — TOXASSURE SELECT 13 (MW), URINE

## 2020-11-21 ENCOUNTER — Encounter: Payer: Self-pay | Admitting: Family Medicine

## 2020-11-21 ENCOUNTER — Ambulatory Visit (INDEPENDENT_AMBULATORY_CARE_PROVIDER_SITE_OTHER): Payer: BC Managed Care – PPO | Admitting: Family Medicine

## 2020-11-21 DIAGNOSIS — F411 Generalized anxiety disorder: Secondary | ICD-10-CM

## 2020-11-21 DIAGNOSIS — I10 Essential (primary) hypertension: Secondary | ICD-10-CM

## 2020-11-21 MED ORDER — ALLOPURINOL 100 MG PO TABS
100.0000 mg | ORAL_TABLET | Freq: Every day | ORAL | 1 refills | Status: DC
Start: 2020-11-21 — End: 2021-05-01

## 2020-11-21 MED ORDER — DILTIAZEM HCL ER COATED BEADS 360 MG PO CP24
ORAL_CAPSULE | ORAL | 1 refills | Status: DC
Start: 2020-11-21 — End: 2021-05-13

## 2020-11-21 MED ORDER — CLONAZEPAM 1 MG PO TABS: ORAL_TABLET | ORAL | 5 refills | Status: DC

## 2020-11-21 MED ORDER — VALSARTAN-HYDROCHLOROTHIAZIDE 320-12.5 MG PO TABS: 1.0000 | ORAL_TABLET | Freq: Every morning | ORAL | 1 refills | Status: DC

## 2020-11-21 NOTE — Progress Notes (Signed)
Subjective:    Patient ID: Philip Shepard, male    DOB: 02-26-1976, 44 y.o.   MRN: [id number]   HPI: Philip Shepard is a 44 y.o. male presenting for three month recheck. BP was 120/80  At blood donation site 3 days ago. No flare of gout in a long time.  Back problem resolved with surgery.No longer having to lift. 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 Using a special, padded work shoe that the company provides and it helps a lot.  Totally different, much better environment. No longer using opiates. Last use was 2-3 months ago.   Anxiety persists. Was forced to resign his previous job and that caused a spike of anxiety. Better since starting the new job 3 days ago.    Depression screen [facility] 2/9 08/29/2020 12/16/2018 12/16/2018 09/24/2018 07/06/2018  Decreased Interest 0 0 0 0 0  Down, Depressed, Hopeless 0 0 0 0 0  PHQ - 2 Score 0 0 0 0 0  Altered sleeping - - - - -  Tired, decreased energy - - - - -  Change in appetite - - - - -  Feeling bad or failure about yourself  - - - - -  Trouble concentrating - - - - -  Moving slowly or fidgety/restless - - - - -  Suicidal thoughts - - - - -  PHQ-9 Score - - - - -     Relevant past medical, surgical, family and social history reviewed and updated as indicated.  Interim medical history since our last visit reviewed. Allergies and medications reviewed and updated.  ROS:  Review of Systems  Constitutional: Negative for fever.  Respiratory: Negative for shortness of breath.   Cardiovascular: Negative for chest pain.  Musculoskeletal: Negative for arthralgias.  Skin: Negative for rash.     Social History   Tobacco Use  Smoking Status Current Every Day Smoker   Types: Cigarettes  Smokeless Tobacco Former Neurosurgeon   Quit date: 09/04/2016       Objective:     Wt Readings from Last 3 Encounters:  08/29/20 236 lb (107 kg)  12/16/18 232 lb 3.2 oz (105.3 kg)  09/24/18 227 lb (103 kg)     Exam  deferred. Pt. Harboring due to COVID 19. Phone visit performed.   Assessment & Plan:   1. Generalized anxiety disorder     Meds ordered this encounter  Medications   clonazePAM (KLONOPIN) 1 MG tablet    Sig: TAKE 1 AND 1/2 TABLETS DAILY BY MOUTH AT BEDTIME    Dispense:  45 tablet    Refill:  5    No orders of the defined types were placed in this encounter.     Diagnoses and all orders for this visit:  Generalized anxiety disorder -     clonazePAM (KLONOPIN) 1 MG tablet; TAKE 1 AND 1/2 TABLETS DAILY BY MOUTH AT BEDTIME    Virtual Visit via telephone Note  I discussed the limitations, risks, security and privacy concerns of performing an evaluation and management service by telephone and the availability of in person appointments. The patient was identified with two identifiers. Pt.expressed understanding and agreed to proceed. Pt. Is at home. Dr. Darlyn Read is in his office.  Follow Up Instructions:   I discussed the assessment and treatment plan with the patient. The patient was provided an opportunity to ask questions and all were answered. The patient agreed with the plan and demonstrated an understanding  of the instructions.   The patient was advised to call back or seek an in-person evaluation if the symptoms worsen or if the condition fails to improve as anticipated.   Total minutes including chart review and phone contact time: 24   Follow up plan: No follow-ups on file.  Mechele Claude, MD [place] [facility]

## 2020-12-05 ENCOUNTER — Ambulatory Visit: Payer: BC Managed Care – PPO | Admitting: Family Medicine

## 2021-01-21 ENCOUNTER — Ambulatory Visit: Payer: Self-pay | Admitting: Family

## 2021-01-21 ENCOUNTER — Telehealth: Payer: Self-pay

## 2021-01-21 NOTE — Telephone Encounter (Signed)
Offered pt appt today with Jannifer Rodney, he declined. Pt has appt with Dr Darlyn Read tomorrow.

## 2021-01-22 ENCOUNTER — Other Ambulatory Visit: Payer: Self-pay

## 2021-01-22 ENCOUNTER — Ambulatory Visit: Payer: 59 | Admitting: Family Medicine

## 2021-01-22 ENCOUNTER — Encounter: Payer: Self-pay | Admitting: Family Medicine

## 2021-01-22 VITALS — BP 139/82 | HR 104 | Temp 98.3°F | Resp 20 | Ht 71.0 in | Wt 242.0 lb

## 2021-01-22 DIAGNOSIS — L02215 Cutaneous abscess of perineum: Secondary | ICD-10-CM | POA: Diagnosis not present

## 2021-01-22 MED ORDER — LINEZOLID 600 MG PO TABS: 600.0000 mg | ORAL_TABLET | Freq: Two times a day (BID) | ORAL | 0 refills | Status: DC

## 2021-01-22 NOTE — Progress Notes (Signed)
No chief complaint on file.   HPI  Patient presents today for painful nodules in the perineal region bilaterally. They have been present for couple of weeks and getting worse. Patient has a history of MRSA a with hidradenitis. He is here today to have those lanced.  PMH: Smoking status noted ROS: Per HPI  Objective: BP 139/82   Pulse (!) 104   Temp 98.3 F (36.8 C) (Temporal)   Resp 20   Ht 5\' 11"  (1.803 m)   Wt 242 lb (109.8 kg)   SpO2 98%   BMI 33.75 kg/m  Gen: NAD, alert, cooperative with exam HEENT: NCAT, EOMI, PERRL CV: RRR, good S1/S2, no murmur Resp: CTABL, no wheezes, non-labored Skin: At the perineal area there are 2 palpable nodules 1 is 1 x 2 cm and freely movable under the surface. The other is 1 x 1 cm just to the left of the first. Both are quite tender there is minimal erythema at the perineal region. Neuro: Alert and oriented, No gross deficits I&D: Region was anesthetized with 2% lidocaine using about 45mL of it. Incision was made on anterioraspect of each nodule. Significant serosanguineous and purulent drainage was exuded.. Forceps was used to probe the area and break apart any loculations. Pressure dressing was placed over top. Bleeding was minimal and patient tolerated procedure well.  Assessment and plan:  1. Abscess, perineum     Meds ordered this encounter  Medications  . linezolid (ZYVOX) 600 MG tablet    Sig: Take 1 tablet (600 mg total) by mouth 2 (two) times daily.    Dispense:  20 tablet    Refill:  0    Follow up as needed.  11m, MD

## 2021-03-18 ENCOUNTER — Encounter: Payer: Self-pay | Admitting: Family Medicine

## 2021-03-18 ENCOUNTER — Ambulatory Visit: Payer: 59 | Admitting: Family Medicine

## 2021-03-18 DIAGNOSIS — F411 Generalized anxiety disorder: Secondary | ICD-10-CM | POA: Diagnosis not present

## 2021-03-18 DIAGNOSIS — I10 Essential (primary) hypertension: Secondary | ICD-10-CM | POA: Diagnosis not present

## 2021-03-18 DIAGNOSIS — R600 Localized edema: Secondary | ICD-10-CM

## 2021-03-18 NOTE — Progress Notes (Signed)
Subjective:  Patient ID: Philip Shepard, male    DOB: November 03, 1976  Age: 45 y.o. MRN: [id number]  CC: No chief complaint on file.   HPI Travius Crochet presents for  follow-up of hypertension. Patient has no history of headache chest pain or shortness of breath or recent cough. Patient also denies symptoms of TIA such as focal numbness or weakness. Patient denies side effects from medication. States taking it regularly. Calves and ankles and belly are swelling. Onset 2 weeks ago.  Using OTC water pill for 3 days. Not checking BP.  He can feel it in ankles and calves.  History [name] has a past medical history of Anxiety, Hypertension, and Sleep disorder.   He has a past surgical history that includes Knee surgery (Left); carpel tunnel left and right; Kidney stone surgery; and Tonsillectomy and adenoidectomy.   His family history includes Arthritis in his father and mother; Cancer in his mother and paternal grandmother; Diabetes in his mother.He reports that he has been smoking cigarettes. He quit smokeless tobacco use about 4 years ago. He reports that he does not drink alcohol and does not use drugs.  Current Outpatient Medications on File Prior to Visit  Medication Sig Dispense Refill  . allopurinol (ZYLOPRIM) 100 MG tablet Take 1 tablet (100 mg total) by mouth daily. 90 tablet 1  . clonazePAM (KLONOPIN) 1 MG tablet TAKE 1 AND 1/2 TABLETS DAILY BY MOUTH AT BEDTIME 45 tablet 5  . diltiazem (CARDIZEM CD) 360 MG 24 hr capsule TAKE 1 CAPSULE BY MOUTH EVERY DAY 90 capsule 1  . valsartan-hydrochlorothiazide (DIOVAN-HCT) 320-12.5 MG tablet Take 1 tablet by mouth every morning. 90 tablet 1   No current facility-administered medications on file prior to visit.    ROS Review of Systems  Constitutional: Positive for activity change (diminished). Negative for fever.  HENT: Negative for congestion.   Respiratory: Negative for cough and shortness of breath.   Gastrointestinal: Positive for abdominal  distention. Negative for diarrhea and nausea.    Objective:  There were no vitals taken for this visit.  BP Readings from Last 3 Encounters:  01/22/21 139/82  08/29/20 130/74  01/04/19 135/76    Wt Readings from Last 3 Encounters:  01/22/21 242 lb (109.8 kg)  08/29/20 236 lb (107 kg)  12/16/18 232 lb 3.2 oz (105.3 kg)     Physical Exam  Exam deferred. Pt. Harboring due to COVID 19. Phone visit performed.   Assessment & Plan:   Diagnoses and all orders for this visit:  Leg edema  Essential hypertension  Generalized anxiety disorder   Allergies as of 03/18/2021      Reactions   Penicillins    Prednisone    Rage, allergic reaction      Medication List       Accurate as of March 18, 2021  9:54 PM. If you have any questions, ask your nurse or doctor.        STOP taking these medications   linezolid 600 MG tablet Commonly known as: Zyvox Stopped by: Mechele Claude, MD     TAKE these medications   allopurinol 100 MG tablet Commonly known as: ZYLOPRIM Take 1 tablet (100 mg total) by mouth daily.   clonazePAM 1 MG tablet Commonly known as: KLONOPIN TAKE 1 AND 1/2 TABLETS DAILY BY MOUTH AT BEDTIME   diltiazem 360 MG 24 hr capsule Commonly known as: CARDIZEM CD TAKE 1 CAPSULE BY MOUTH EVERY DAY   valsartan-hydrochlorothiazide 320-12.5 MG tablet Commonly known as:  DIOVAN-HCT Take 1 tablet by mouth every morning.       Avoid the OTC fluid pill. Elevate feet  Virtual Visit via telephone Note  I discussed the limitations, risks, security and privacy concerns of performing an evaluation and management service by telephone and the availability of in person appointments. I also discussed with the patient that there may be a patient responsible charge related to this service. The patient expressed understanding and agreed to proceed. Pt. Is at home. Dr. Darlyn Read is in his office.  Follow Up Instructions:   I discussed the assessment and treatment plan with  the patient. The patient was provided an opportunity to ask questions and all were answered. The patient agreed with the plan and demonstrated an understanding of the instructions.   The patient was advised to call back or seek an in-person evaluation if the symptoms worsen or if the condition fails to improve as anticipated.  Total minutes including chart review and phone contact time: 17   Follow-up: Return in 4 days (on 03/22/2021) for edema.  Mechele Claude, M.D.

## 2021-03-20 ENCOUNTER — Ambulatory Visit: Payer: 59 | Admitting: Family Medicine

## 2021-03-22 ENCOUNTER — Ambulatory Visit (INDEPENDENT_AMBULATORY_CARE_PROVIDER_SITE_OTHER): Payer: 59

## 2021-03-22 ENCOUNTER — Other Ambulatory Visit: Payer: Self-pay

## 2021-03-22 ENCOUNTER — Ambulatory Visit: Payer: 59 | Admitting: Family Medicine

## 2021-03-22 ENCOUNTER — Encounter: Payer: Self-pay | Admitting: Family Medicine

## 2021-03-22 VITALS — BP 125/62 | HR 99 | Temp 97.5°F | Resp 20 | Ht 71.0 in | Wt 249.0 lb

## 2021-03-22 DIAGNOSIS — R601 Generalized edema: Secondary | ICD-10-CM | POA: Diagnosis not present

## 2021-03-22 DIAGNOSIS — R188 Other ascites: Secondary | ICD-10-CM | POA: Diagnosis not present

## 2021-03-22 LAB — URINALYSIS
Bilirubin, UA: NEGATIVE
Glucose, UA: NEGATIVE
Ketones, UA: NEGATIVE
Leukocytes,UA: NEGATIVE
Nitrite, UA: NEGATIVE
Protein,UA: NEGATIVE
Specific Gravity, UA: 1.02 (ref 1.005–1.030)
Urobilinogen, Ur: 0.2 mg/dL (ref 0.2–1.0)
pH, UA: 6 (ref 5.0–7.5)

## 2021-03-22 MED ORDER — VALSARTAN 320 MG PO TABS: 320.0000 mg | ORAL_TABLET | Freq: Every day | ORAL | 1 refills | Status: DC

## 2021-03-22 MED ORDER — CHLORTHALIDONE 25 MG PO TABS: 25.0000 mg | ORAL_TABLET | Freq: Every day | ORAL | 3 refills | Status: DC

## 2021-03-22 NOTE — Progress Notes (Signed)
Subjective:  Patient ID: Philip Shepard, male    DOB: [birth year]  Age: 46 y.o. MRN: [id number]  CC: Edema (Bilat legs, feet and abdomen )   HPI Philip Shepard presents for swelling in the lower body especially the ankles and shins bilateral. Staying active. 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  Swelling started at least three weeks ago. Wife feels belly is swollen too.  Depression screen [facility] 2/9 03/22/2021 01/22/2021 08/29/2020  Decreased Interest 0 0 0  Down, Depressed, Hopeless 0 0 0  PHQ - 2 Score 0 0 0  Altered sleeping - - -  Tired, decreased energy - - -  Change in appetite - - -  Feeling bad or failure about yourself  - - -  Trouble concentrating - - -  Moving slowly or fidgety/restless - - -  Suicidal thoughts - - -  PHQ-9 Score - - -    History Philip Shepard has a past medical history of Anxiety, Hypertension, and Sleep disorder.   He has a past surgical history that includes Knee surgery (Left); carpel tunnel left and right; Kidney stone surgery; and Tonsillectomy and adenoidectomy.   His family history includes Arthritis in his father and mother; Cancer in his mother and paternal grandmother; Diabetes in his mother.He reports that he has been smoking cigarettes. He quit smokeless tobacco use about 4 years ago. He reports that he does not drink alcohol and does not use drugs.    ROS Review of Systems  Constitutional: Negative for fever.  Respiratory: Negative for shortness of breath.   Cardiovascular: Positive for leg swelling. Negative for chest pain.  Gastrointestinal: Positive for abdominal distention (swelling). Negative for abdominal pain, diarrhea and vomiting.  Musculoskeletal: Positive for arthralgias (hips) and back pain.  Skin: Negative for rash.    Objective:  BP 125/62   Pulse 99   Temp (!) 97.5 F (36.4 C)   Resp 20   Ht 5' 11"  (1.803 m)   Wt 249 lb (112.9 kg)   SpO2 98%   BMI 34.73 kg/m   BP Readings from Last 3  Encounters:  03/22/21 125/62  01/22/21 139/82  08/29/20 130/74    Wt Readings from Last 3 Encounters:  03/22/21 249 lb (112.9 kg)  01/22/21 242 lb (109.8 kg)  08/29/20 236 lb (107 kg)     Physical Exam    Assessment & Plan:   Philip Shepard was seen today for edema.  Diagnoses and all orders for this visit:  Generalized edema -     DG Abd Acute W/Chest; Future -     Urinalysis -     Urine Culture -     CBC with Differential/Platelet -     CMP14+EGFR -     Lipase  Other ascites -     DG Abd Acute W/Chest; Future -     Urinalysis -     Urine Culture -     CBC with Differential/Platelet -     CMP14+EGFR -     Lipase  Other orders -     valsartan (DIOVAN) 320 MG tablet; Take 1 tablet (320 mg total) by mouth daily. For blood pressure. -     chlorthalidone (HYGROTON) 25 MG tablet; Take 1 tablet (25 mg total) by mouth daily. For BP & swelling       I am having Philip Shepard start on valsartan and chlorthalidone. I am also having him maintain his clonazePAM, allopurinol, diltiazem,  and valsartan-hydrochlorothiazide.  Allergies as of 03/22/2021      Reactions   Penicillins    Prednisone    Rage, allergic reaction      Medication List       Accurate as of March 22, 2021 10:07 AM. If you have any questions, ask your nurse or doctor.        allopurinol 100 MG tablet Commonly known as: ZYLOPRIM Take 1 tablet (100 mg total) by mouth daily.   chlorthalidone 25 MG tablet Commonly known as: HYGROTON Take 1 tablet (25 mg total) by mouth daily. For BP & swelling Started by: Claretta Fraise, MD   clonazePAM 1 MG tablet Commonly known as: KLONOPIN TAKE 1 AND 1/2 TABLETS DAILY BY MOUTH AT BEDTIME   diltiazem 360 MG 24 hr capsule Commonly known as: CARDIZEM CD TAKE 1 CAPSULE BY MOUTH EVERY DAY   valsartan 320 MG tablet Commonly known as: DIOVAN Take 1 tablet (320 mg total) by mouth daily. For blood pressure. Started by: Claretta Fraise, MD   valsartan-hydrochlorothiazide  320-12.5 MG tablet Commonly known as: DIOVAN-HCT Take 1 tablet by mouth every morning.        Follow-up: Return in about 3 weeks (around 04/12/2021).  Claretta Fraise, M.D.

## 2021-03-23 LAB — CMP14+EGFR
ALT: 60 IU/L — ABNORMAL HIGH (ref 0–44)
AST: 28 IU/L (ref 0–40)
Albumin/Globulin Ratio: 2 (ref 1.2–2.2)
Albumin: 4.9 g/dL (ref 4.0–5.0)
Alkaline Phosphatase: 76 IU/L (ref 44–121)
BUN/Creatinine Ratio: 15 (ref 9–20)
BUN: 15 mg/dL (ref 6–24)
Bilirubin Total: 0.5 mg/dL (ref 0.0–1.2)
CO2: 24 mmol/L (ref 20–29)
Calcium: 10 mg/dL (ref 8.7–10.2)
Chloride: 103 mmol/L (ref 96–106)
Creatinine, Ser: 0.99 mg/dL (ref 0.76–1.27)
Globulin, Total: 2.5 g/dL (ref 1.5–4.5)
Glucose: 79 mg/dL (ref 65–99)
Potassium: 4.4 mmol/L (ref 3.5–5.2)
Sodium: 142 mmol/L (ref 134–144)
Total Protein: 7.4 g/dL (ref 6.0–8.5)
eGFR: 98 mL/min/{1.73_m2} (ref >59)

## 2021-03-23 LAB — CBC WITH DIFFERENTIAL/PLATELET
Basophils Absolute: 0.1 10*3/uL (ref 0.0–0.2)
Basos: 1 %
EOS (ABSOLUTE): 0.1 10*3/uL (ref 0.0–0.4)
Eos: 1 %
Hematocrit: 47.2 % (ref 37.5–51.0)
Hemoglobin: 16.3 g/dL (ref 13.0–17.7)
Immature Grans (Abs): 0 10*3/uL (ref 0.0–0.1)
Immature Granulocytes: 0 %
Lymphocytes Absolute: 2.1 10*3/uL (ref 0.7–3.1)
Lymphs: 27 %
MCH: 31.4 pg (ref 26.6–33.0)
MCHC: 34.5 g/dL (ref 31.5–35.7)
MCV: 91 fL (ref 79–97)
Monocytes Absolute: 0.9 10*3/uL (ref 0.1–0.9)
Monocytes: 12 %
Neutrophils Absolute: 4.6 10*3/uL (ref 1.4–7.0)
Neutrophils: 59 %
Platelets: 250 10*3/uL (ref 150–450)
RBC: 5.19 x10E6/uL (ref 4.14–5.80)
RDW: 13.3 % (ref 11.6–15.4)
WBC: 7.8 10*3/uL (ref 3.4–10.8)

## 2021-03-23 LAB — LIPASE: Lipase: 50 U/L (ref 13–78)

## 2021-03-24 LAB — URINE CULTURE

## 2021-03-25 NOTE — Progress Notes (Signed)
Hello Treyon,  Your lab result is normal and/or stable.Some minor variations that are not significant are commonly marked abnormal, but do not represent any medical problem for you.  Best regards, Mechele Claude, M.D.

## 2021-03-26 ENCOUNTER — Encounter: Payer: Self-pay | Admitting: Family Medicine

## 2021-03-26 ENCOUNTER — Ambulatory Visit: Payer: 59 | Admitting: Family Medicine

## 2021-03-29 ENCOUNTER — Other Ambulatory Visit: Payer: Self-pay | Admitting: Family Medicine

## 2021-04-02 ENCOUNTER — Telehealth: Payer: Self-pay | Admitting: Family Medicine

## 2021-04-02 ENCOUNTER — Other Ambulatory Visit: Payer: Self-pay | Admitting: Family Medicine

## 2021-04-02 DIAGNOSIS — M8700 Idiopathic aseptic necrosis of unspecified bone: Secondary | ICD-10-CM

## 2021-04-02 NOTE — Progress Notes (Signed)
Pt. Was notified of results and plan of action. MRIs ordered [facility]

## 2021-04-02 NOTE — Telephone Encounter (Signed)
Do you know anything about the MRI they are speaking about?

## 2021-04-02 NOTE — Telephone Encounter (Signed)
Patient called and wanted to let referrals know that the patients insurance has changed from [facility] to [facility] [employer]. I put the insurance info in on the upcoming appt registration.

## 2021-04-08 ENCOUNTER — Telehealth: Payer: Self-pay

## 2021-04-08 ENCOUNTER — Other Ambulatory Visit: Payer: Self-pay | Admitting: *Deleted

## 2021-04-08 NOTE — Telephone Encounter (Signed)
Patient wanted to make you aware that he was scheduled for a 3 week follow up with you on 04/11/21 but he rescheduled because he was not going for his MRI until the next day, 04/12/21, and wanted you to have those results at his appointment.  He has rescheduled his appointment with you to 04/18/21.

## 2021-04-11 ENCOUNTER — Ambulatory Visit: Payer: No Typology Code available for payment source | Admitting: Family Medicine

## 2021-04-18 ENCOUNTER — Encounter: Payer: Self-pay | Admitting: Family Medicine

## 2021-04-18 ENCOUNTER — Ambulatory Visit (INDEPENDENT_AMBULATORY_CARE_PROVIDER_SITE_OTHER): Payer: No Typology Code available for payment source | Admitting: Family Medicine

## 2021-04-18 ENCOUNTER — Other Ambulatory Visit: Payer: Self-pay

## 2021-04-18 VITALS — BP 128/81 | HR 89 | Temp 98.3°F | Ht 71.0 in | Wt 243.8 lb

## 2021-04-18 DIAGNOSIS — M879 Osteonecrosis, unspecified: Secondary | ICD-10-CM | POA: Diagnosis not present

## 2021-04-18 DIAGNOSIS — R2240 Localized swelling, mass and lump, unspecified lower limb: Secondary | ICD-10-CM | POA: Diagnosis not present

## 2021-04-18 NOTE — Progress Notes (Signed)
Subjective:  Patient ID: Philip Shepard, male    DOB: [birth year]  Age: 45 y.o. MRN: [id number]  CC: Follow-up   HPI Philip Shepard presents for lesion at left inner thigh near perineum. Painful. Not draining requests lancing.  Having significant pain in the hips. Recently had XR suggesting osteonecrosis. This was confirmed on MRI. Pt. And wife here tody for discussion.  Depression screen Philip Shepard 2/9 04/18/2021 03/22/2021 01/22/2021  Decreased Interest 0 0 0  Down, Depressed, Hopeless 0 0 0  PHQ - 2 Score 0 0 0  Altered sleeping - - -  Tired, decreased energy - - -  Change in appetite - - -  Feeling bad or failure about yourself  - - -  Trouble concentrating - - -  Moving slowly or fidgety/restless - - -  Suicidal thoughts - - -  PHQ-9 Score - - -    History Philip Shepard has a past medical history of Anxiety, Hypertension, and Sleep disorder.   He has a past surgical history that includes Knee surgery (Left); carpel tunnel left and right; Kidney stone surgery; and Tonsillectomy and adenoidectomy.   His family history includes Arthritis in his father and mother; Cancer in his mother and paternal grandmother; Diabetes in his mother.He reports that he has been smoking cigarettes. He quit smokeless tobacco use about 4 years ago. He reports that he does not drink alcohol and does not use drugs.    ROS Review of Systems  Constitutional: Negative for fever.  Respiratory: Negative for shortness of breath.   Cardiovascular: Negative for chest pain.  Gastrointestinal: Negative for abdominal pain (resolved).  Musculoskeletal: Positive for arthralgias (hips) and back pain.  Skin: Negative for rash.    Objective:  BP 128/81   Pulse 89   Temp 98.3 F (36.8 C)   Ht 5\' 11"  (1.803 m)   Wt 243 lb 12.8 oz (110.6 kg)   SpO2 98%   BMI 34.00 kg/m   BP Readings from Last 3 Encounters:  04/18/21 128/81  03/22/21 125/62  01/22/21 139/82    Wt Readings from Last 3 Encounters:  04/18/21 243 lb 12.8 oz  (110.6 kg)  03/22/21 249 lb (112.9 kg)  01/22/21 242 lb (109.8 kg)     Physical Exam Vitals reviewed.  Constitutional:      Appearance: He is well-developed.  HENT:     Head: Normocephalic and atraumatic.     Right Ear: External ear normal.     Left Ear: External ear normal.     Mouth/Throat:     Pharynx: No oropharyngeal exudate or posterior oropharyngeal erythema.  Eyes:     Pupils: Pupils are equal, round, and reactive to light.  Cardiovascular:     Rate and Rhythm: Normal rate and regular rhythm.     Heart sounds: No murmur heard.   Pulmonary:     Effort: No respiratory distress.     Breath sounds: Normal breath sounds.  Musculoskeletal:     Cervical back: Normal range of motion and neck supple.  Skin:    General: Skin is warm and dry.     Findings: Lesion (there is a freely movable nodule at the left upper thigh adjacent to the perineum. There is tenderness without erythema or fluctuanceor edema. There is an open head that is not draining.) present.  Neurological:     Mental Status: He is alert and oriented to person, place, and time.       Assessment & Plan:   Philip Shepard was seen  today for follow-up.  Diagnoses and all orders for this visit:  Osteonecrosis of both hips [facility]) -     Ambulatory referral to Orthopedics  Nodule of skin of lower extremity -     Ambulatory referral to General Surgery       I am having Philip Shepard maintain his clonazePAM, allopurinol, diltiazem, valsartan, and chlorthalidone.  Allergies as of 04/18/2021      Reactions   Penicillins    Prednisone    Rage, allergic reaction      Medication List       Accurate as of Apr 18, 2021 11:59 PM. If you have any questions, ask your nurse or doctor.        allopurinol 100 MG tablet Commonly known as: ZYLOPRIM Take 1 tablet (100 mg total) by mouth daily.   chlorthalidone 25 MG tablet Commonly known as: HYGROTON Take 1 tablet (25 mg total) by mouth daily. For BP & swelling    clonazePAM 1 MG tablet Commonly known as: KLONOPIN TAKE 1 AND 1/2 TABLETS DAILY BY MOUTH AT BEDTIME   diltiazem 360 MG 24 hr capsule Commonly known as: CARDIZEM CD TAKE 1 CAPSULE BY MOUTH EVERY DAY   valsartan 320 MG tablet Commonly known as: DIOVAN Take 1 tablet (320 mg total) by mouth daily. For blood pressure.      35 minutes was spent for this visit. Over 1/2 in consultation regarding plan of treatment for the osteonecrosis and the nodule.  Follow-up: Return in about 3 months (around 07/19/2021), or if symptoms worsen or fail to improve.  Mechele Claude, M.D.

## 2021-04-24 ENCOUNTER — Encounter: Payer: Self-pay | Admitting: Family Medicine

## 2021-05-01 ENCOUNTER — Other Ambulatory Visit: Payer: Self-pay | Admitting: *Deleted

## 2021-05-01 MED ORDER — ALLOPURINOL 100 MG PO TABS: 100.0000 mg | ORAL_TABLET | Freq: Every day | ORAL | 1 refills | Status: DC

## 2021-05-11 ENCOUNTER — Other Ambulatory Visit: Payer: Self-pay | Admitting: Family Medicine

## 2021-05-20 ENCOUNTER — Telehealth: Payer: Self-pay | Admitting: Family Medicine

## 2021-05-20 NOTE — Telephone Encounter (Signed)
Wife called and is concerned because patient is continuing to gain weight and have swelling in his feet, legs and abdomen.  He is taking the Chlorthalidone 25 mg as directed.  He has seen specialists we referred to.  She wanted patient to be seen asap so an appointment is scheduled for 05/22/21 at 3:55 pm.

## 2021-05-20 NOTE — Telephone Encounter (Signed)
Sounds appropriate.  Thanks.

## 2021-05-20 NOTE — Telephone Encounter (Signed)
Pt's spouse has question about med. Wants to talk to nurse

## 2021-05-21 ENCOUNTER — Ambulatory Visit: Payer: No Typology Code available for payment source | Admitting: Family Medicine

## 2021-05-22 ENCOUNTER — Ambulatory Visit: Payer: No Typology Code available for payment source | Admitting: Family Medicine

## 2021-05-22 ENCOUNTER — Encounter: Payer: Self-pay | Admitting: Family Medicine

## 2021-05-22 ENCOUNTER — Other Ambulatory Visit: Payer: Self-pay

## 2021-05-22 VITALS — BP 136/83 | HR 100 | Temp 98.6°F | Ht 71.0 in | Wt 247.0 lb

## 2021-05-22 DIAGNOSIS — R601 Generalized edema: Secondary | ICD-10-CM

## 2021-05-22 DIAGNOSIS — F411 Generalized anxiety disorder: Secondary | ICD-10-CM

## 2021-05-22 MED ORDER — MUPIROCIN 2 % EX OINT: TOPICAL_OINTMENT | CUTANEOUS | 5 refills | Status: AC

## 2021-05-22 MED ORDER — CLONAZEPAM 1 MG PO TABS: ORAL_TABLET | ORAL | 5 refills | Status: DC

## 2021-05-22 MED ORDER — ALLOPURINOL 100 MG PO TABS: 100.0000 mg | ORAL_TABLET | Freq: Every day | ORAL | 1 refills | Status: DC

## 2021-05-22 NOTE — Progress Notes (Signed)
Subjective:  Patient ID: Philip Shepard, male    DOB: 01/28/1976  Age: 45 y.o. MRN: [id number]  CC: Edema   HPI Philip Shepard presents for  edema.  He loses weight from Monday through Thursday with his current diet of about 1500 cal a day following an Atkins program.  However he works Monday through Thursday and loses weight.  When he is home on Friday Saturday and Sunday he gained it all back by swelling.  His wife tells me that he is got 25 pounds of fluid.  She notices that his belly swells and his legs swell.  She is concerned for heart failure as the source.  follow-up of hypertension. Patient has no history of headache chest pain or shortness of breath or recent cough. Patient also denies symptoms of TIA such as focal numbness or weakness. Patient denies side effects from medication. States taking it regularly.  Depression screen [facility] 2/9 05/22/2021 04/18/2021 03/22/2021 01/22/2021 08/29/2020  Decreased Interest 0 0 0 0 0  Down, Depressed, Hopeless 0 0 0 0 0  PHQ - 2 Score 0 0 0 0 0  Altered sleeping - - - - -  Tired, decreased energy - - - - -  Change in appetite - - - - -  Feeling bad or failure about yourself  - - - - -  Trouble concentrating - - - - -  Moving slowly or fidgety/restless - - - - -  Suicidal thoughts - - - - -  PHQ-9 Score - - - - -      History Cainen has a past medical history of Anxiety, Hypertension, and Sleep disorder.   He has a past surgical history that includes Knee surgery (Left); carpel tunnel left and right; Kidney stone surgery; and Tonsillectomy and adenoidectomy.   His family history includes Arthritis in his father and mother; Cancer in his mother and paternal grandmother; Diabetes in his mother.He reports that he has been smoking cigarettes. He quit smokeless tobacco use about 4 years ago. He reports that he does not drink alcohol and does not use drugs.  Current Outpatient Medications on File Prior to Visit  Medication Sig Dispense Refill    allopurinol (ZYLOPRIM) 100 MG tablet Take 1 tablet (100 mg total) by mouth daily. 90 tablet 1   chlorthalidone (HYGROTON) 25 MG tablet Take 1 tablet (25 mg total) by mouth daily. For BP & swelling 90 tablet 3   clonazePAM (KLONOPIN) 1 MG tablet TAKE 1 AND 1/2 TABLETS DAILY BY MOUTH AT BEDTIME 45 tablet 5   diltiazem (CARDIZEM CD) 360 MG 24 hr capsule TAKE 1 CAPSULE BY MOUTH EVERY DAY 90 capsule 0   valsartan (DIOVAN) 320 MG tablet Take 1 tablet (320 mg total) by mouth daily. For blood pressure. 90 tablet 1   No current facility-administered medications on file prior to visit.    ROS Review of Systems  Objective:  BP 136/83   Pulse 100   Temp 98.6 F (37 C)   Ht 5\' 11"  (1.803 m)   Wt 247 lb (112 kg)   SpO2 98%   BMI 34.45 kg/m   BP Readings from Last 3 Encounters:  05/22/21 136/83  04/18/21 128/81  03/22/21 125/62    Wt Readings from Last 3 Encounters:  05/22/21 247 lb (112 kg)  04/18/21 243 lb 12.8 oz (110.6 kg)  03/22/21 249 lb (112.9 kg)     Physical Exam    Assessment & Plan:   [name] was seen today  for edema.  Diagnoses and all orders for this visit:  Generalized anxiety disorder   Allergies as of 05/22/2021       Reactions   Penicillins    Prednisone    Rage, allergic reaction        Medication List        Accurate as of May 22, 2021  4:19 PM. If you have any questions, ask your nurse or doctor.          allopurinol 100 MG tablet Commonly known as: ZYLOPRIM Take 1 tablet (100 mg total) by mouth daily.   chlorthalidone 25 MG tablet Commonly known as: HYGROTON Take 1 tablet (25 mg total) by mouth daily. For BP & swelling   clonazePAM 1 MG tablet Commonly known as: KLONOPIN TAKE 1 AND 1/2 TABLETS DAILY BY MOUTH AT BEDTIME   diltiazem 360 MG 24 hr capsule Commonly known as: CARDIZEM CD TAKE 1 CAPSULE BY MOUTH EVERY DAY   valsartan 320 MG tablet Commonly known as: DIOVAN Take 1 tablet (320 mg total) by mouth daily. For blood  pressure.        No orders of the defined types were placed in this encounter.   Explained to the patient and his wife and daughter that there is no known medical condition that causes swelling just on certain days of the week B weekday or weekend.  If there is a problem with swelling specifically at that time of the week then there would have to be some type of change in behaviors including activities diet etc.  Patient adamantly denies this.  He is not drinking any alcohol.  For now as result we will hold off on the diltiazem and valsartan have him hold those and see if with a stringent diet he and his begin losing weight when not taking those medicines that can cause side effect of edema.  He will continue the chlorthalidone, clonazepam, Zyloprim.  He will discontinue the use of sodas including [facility].  He will continue drinking high doses of water.  He is going to go on the [employer]  His wife's been using for 3 months and has lost 40 pounds.  He does not want to go on a GLP 1 due to the need to inject himself.  Follow-up: No follow-ups on file.  Mechele Claude, M.D.

## 2021-05-23 LAB — CBC WITH DIFFERENTIAL/PLATELET
Basophils Absolute: 0.1 10*3/uL (ref 0.0–0.2)
Basos: 1 %
EOS (ABSOLUTE): 0.1 10*3/uL (ref 0.0–0.4)
Eos: 1 %
Hematocrit: 44.3 % (ref 37.5–51.0)
Hemoglobin: 15.6 g/dL (ref 13.0–17.7)
Immature Grans (Abs): 0 10*3/uL (ref 0.0–0.1)
Immature Granulocytes: 0 %
Lymphocytes Absolute: 2.8 10*3/uL (ref 0.7–3.1)
Lymphs: 36 %
MCH: 32 pg (ref 26.6–33.0)
MCHC: 35.2 g/dL (ref 31.5–35.7)
MCV: 91 fL (ref 79–97)
Monocytes Absolute: 0.6 10*3/uL (ref 0.1–0.9)
Monocytes: 8 %
Neutrophils Absolute: 4.2 10*3/uL (ref 1.4–7.0)
Neutrophils: 54 %
Platelets: 268 10*3/uL (ref 150–450)
RBC: 4.88 x10E6/uL (ref 4.14–5.80)
RDW: 12.9 % (ref 11.6–15.4)
WBC: 7.7 10*3/uL (ref 3.4–10.8)

## 2021-05-23 LAB — CMP14+EGFR
ALT: 55 IU/L — ABNORMAL HIGH (ref 0–44)
AST: 30 IU/L (ref 0–40)
Albumin/Globulin Ratio: 2.5 — ABNORMAL HIGH (ref 1.2–2.2)
Albumin: 5.2 g/dL — ABNORMAL HIGH (ref 4.0–5.0)
Alkaline Phosphatase: 68 IU/L (ref 44–121)
BUN/Creatinine Ratio: 13 (ref 9–20)
BUN: 13 mg/dL (ref 6–24)
Bilirubin Total: 0.4 mg/dL (ref 0.0–1.2)
CO2: 27 mmol/L (ref 20–29)
Calcium: 10.1 mg/dL (ref 8.7–10.2)
Chloride: 95 mmol/L — ABNORMAL LOW (ref 96–106)
Creatinine, Ser: 1.02 mg/dL (ref 0.76–1.27)
Globulin, Total: 2.1 g/dL (ref 1.5–4.5)
Glucose: 117 mg/dL — ABNORMAL HIGH (ref 65–99)
Potassium: 3.5 mmol/L (ref 3.5–5.2)
Sodium: 140 mmol/L (ref 134–144)
Total Protein: 7.3 g/dL (ref 6.0–8.5)
eGFR: 94 mL/min/{1.73_m2} (ref >59)

## 2021-05-23 LAB — PRO B NATRIURETIC PEPTIDE: NT-Pro BNP: <5 pg/mL (ref 0–86)

## 2021-06-05 ENCOUNTER — Ambulatory Visit (INDEPENDENT_AMBULATORY_CARE_PROVIDER_SITE_OTHER): Payer: No Typology Code available for payment source | Admitting: Family Medicine

## 2021-06-05 ENCOUNTER — Encounter: Payer: Self-pay | Admitting: Family Medicine

## 2021-06-05 DIAGNOSIS — L02215 Cutaneous abscess of perineum: Secondary | ICD-10-CM | POA: Diagnosis not present

## 2021-06-05 DIAGNOSIS — R601 Generalized edema: Secondary | ICD-10-CM

## 2021-06-05 DIAGNOSIS — I10 Essential (primary) hypertension: Secondary | ICD-10-CM | POA: Diagnosis not present

## 2021-06-05 MED ORDER — VALSARTAN 320 MG PO TABS: 320.0000 mg | ORAL_TABLET | Freq: Every day | ORAL | 1 refills | Status: DC

## 2021-06-05 MED ORDER — FUROSEMIDE 40 MG PO TABS: 40.0000 mg | ORAL_TABLET | Freq: Every day | ORAL | 5 refills | Status: DC

## 2021-06-05 MED ORDER — POTASSIUM CHLORIDE CRYS ER 20 MEQ PO TBCR: 20.0000 meq | EXTENDED_RELEASE_TABLET | Freq: Every day | ORAL | 5 refills | Status: DC

## 2021-06-05 MED ORDER — DILTIAZEM HCL ER COATED BEADS 360 MG PO CP24: 360.0000 mg | ORAL_CAPSULE | Freq: Every day | ORAL | 11 refills | Status: DC

## 2021-06-05 NOTE — Progress Notes (Signed)
Subjective:    Patient ID: Philip Shepard, male    DOB: 1976/04/15, 45 y.o.   MRN: [id number]   HPI: Philip Shepard is a 45 y.o. male presenting for swelling all over. Went to ED and was given lasix 40 and potassium "big pill" to take qod. It is bringing the swelling  down. He is out of it now, though. Feels going off of BP med made him feel bad. Has been eating bananas with the lasix.   The bump on his groin swelled up and busted a few days ago. He said it smelled really bad and surgery cut it out in the E.D. Now concerned that it is oozing yellow liquid.  Denies redness, swelling, pain and pus currently. HE saw Jackqulyn Livings, NP at [facility] yesterday for follow up. Report reviewed. HE was prescribed antibiotics as well at time of I&D.    Depression screen [facility] 2/9 05/22/2021 04/18/2021 03/22/2021 01/22/2021 08/29/2020  Decreased Interest 0 0 0 0 0  Down, Depressed, Hopeless 0 0 0 0 0  PHQ - 2 Score 0 0 0 0 0  Altered sleeping - - - - -  Tired, decreased energy - - - - -  Change in appetite - - - - -  Feeling bad or failure about yourself  - - - - -  Trouble concentrating - - - - -  Moving slowly or fidgety/restless - - - - -  Suicidal thoughts - - - - -  PHQ-9 Score - - - - -     Relevant past medical, surgical, family and social history reviewed and updated as indicated.  Interim medical history since our last visit reviewed. Allergies and medications reviewed and updated.  ROS:  Review of Systems  Constitutional:  Negative for fever.  Respiratory:  Negative for shortness of breath.   Cardiovascular:  Negative for chest pain.  Musculoskeletal:  Positive for back pain. Negative for arthralgias.  Skin:  Negative for rash.    Social History   Tobacco Use  Smoking Status Every Day   Pack years: 0.00   Types: Cigarettes  Smokeless Tobacco Former   Quit date: 09/04/2016       Objective:     Wt Readings from Last 3 Encounters:  05/22/21 247 lb (112 kg)  04/18/21 243 lb  12.8 oz (110.6 kg)  03/22/21 249 lb (112.9 kg)     Exam deferred. Pt. Harboring due to COVID 19. Phone visit performed.   Assessment & Plan:   1. Essential hypertension   2. Abscess, perineum   3. Generalized edema     Meds ordered this encounter  Medications   potassium chloride SA (KLOR-CON) 20 MEQ tablet    Sig: Take 1 tablet (20 mEq total) by mouth daily. For potassium replacement/ supplement    Dispense:  30 tablet    Refill:  5   furosemide (LASIX) 40 MG tablet    Sig: Take 1 tablet (40 mg total) by mouth daily. For swelling    Dispense:  30 tablet    Refill:  5   valsartan (DIOVAN) 320 MG tablet    Sig: Take 1 tablet (320 mg total) by mouth daily. For blood pressure.    Dispense:  90 tablet    Refill:  1   diltiazem (CARDIZEM CD) 360 MG 24 hr capsule    Sig: Take 1 capsule (360 mg total) by mouth daily.    Refill:  11    No orders of  the defined types were placed in this encounter.     Diagnoses and all orders for this visit:  Essential hypertension  Abscess, perineum  Generalized edema  Other orders -     potassium chloride SA (KLOR-CON) 20 MEQ tablet; Take 1 tablet (20 mEq total) by mouth daily. For potassium replacement/ supplement -     furosemide (LASIX) 40 MG tablet; Take 1 tablet (40 mg total) by mouth daily. For swelling -     valsartan (DIOVAN) 320 MG tablet; Take 1 tablet (320 mg total) by mouth daily. For blood pressure. -     diltiazem (CARDIZEM CD) 360 MG 24 hr capsule; Take 1 capsule (360 mg total) by mouth daily.   Virtual Visit via telephone Note  I discussed the limitations, risks, security and privacy concerns of performing an evaluation and management service by telephone and the availability of in person appointments. The patient was identified with two identifiers. Pt.expressed understanding and agreed to proceed. Pt. Is at home. Dr. Darlyn Read is in his office.  Follow Up Instructions:   I discussed the assessment and treatment plan  with the patient. The patient was provided an opportunity to ask questions and all were answered. The patient agreed with the plan and demonstrated an understanding of the instructions.   The patient was advised to call back or seek an in-person evaluation if the symptoms worsen or if the condition fails to improve as anticipated.   Total minutes including chart review and phone contact time: 22   Follow up plan: Return in about 2 weeks (around 06/19/2021).  Mechele Claude, MD [facility]

## 2021-06-22 ENCOUNTER — Other Ambulatory Visit: Payer: Self-pay | Admitting: Family Medicine

## 2021-09-21 IMAGING — DX DG ABDOMEN ACUTE W/ 1V CHEST
4 series · 4 of 4 positions shown · non-contrast
Comparison: None.

CLINICAL DATA: Edema.  Ascites.

EXAM:
DG ABDOMEN ACUTE WITH 1 VIEW CHEST

[chest pa]
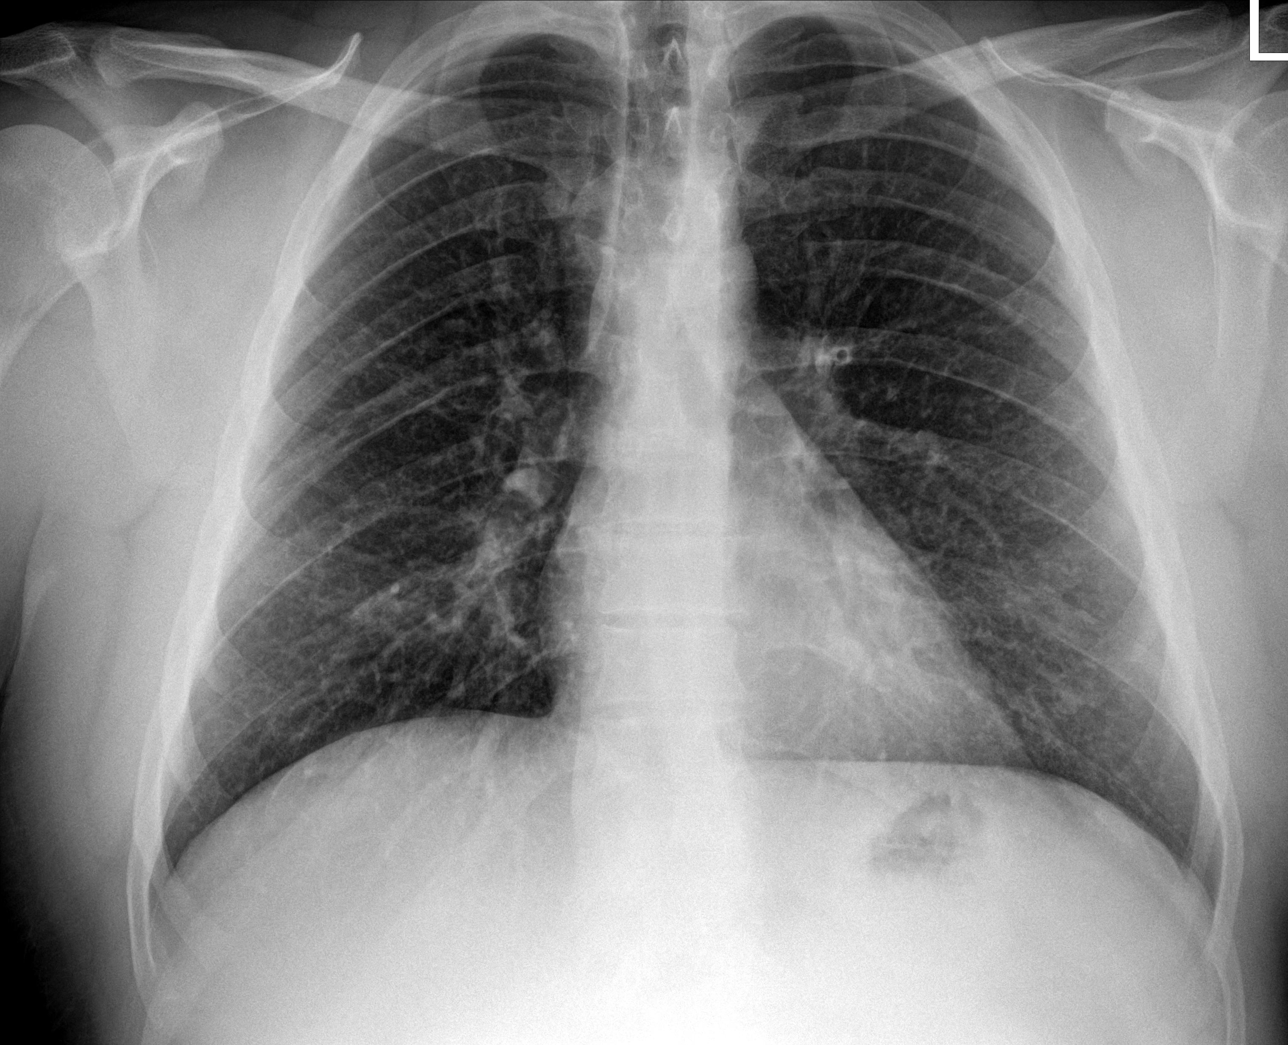

[abdomen erect]
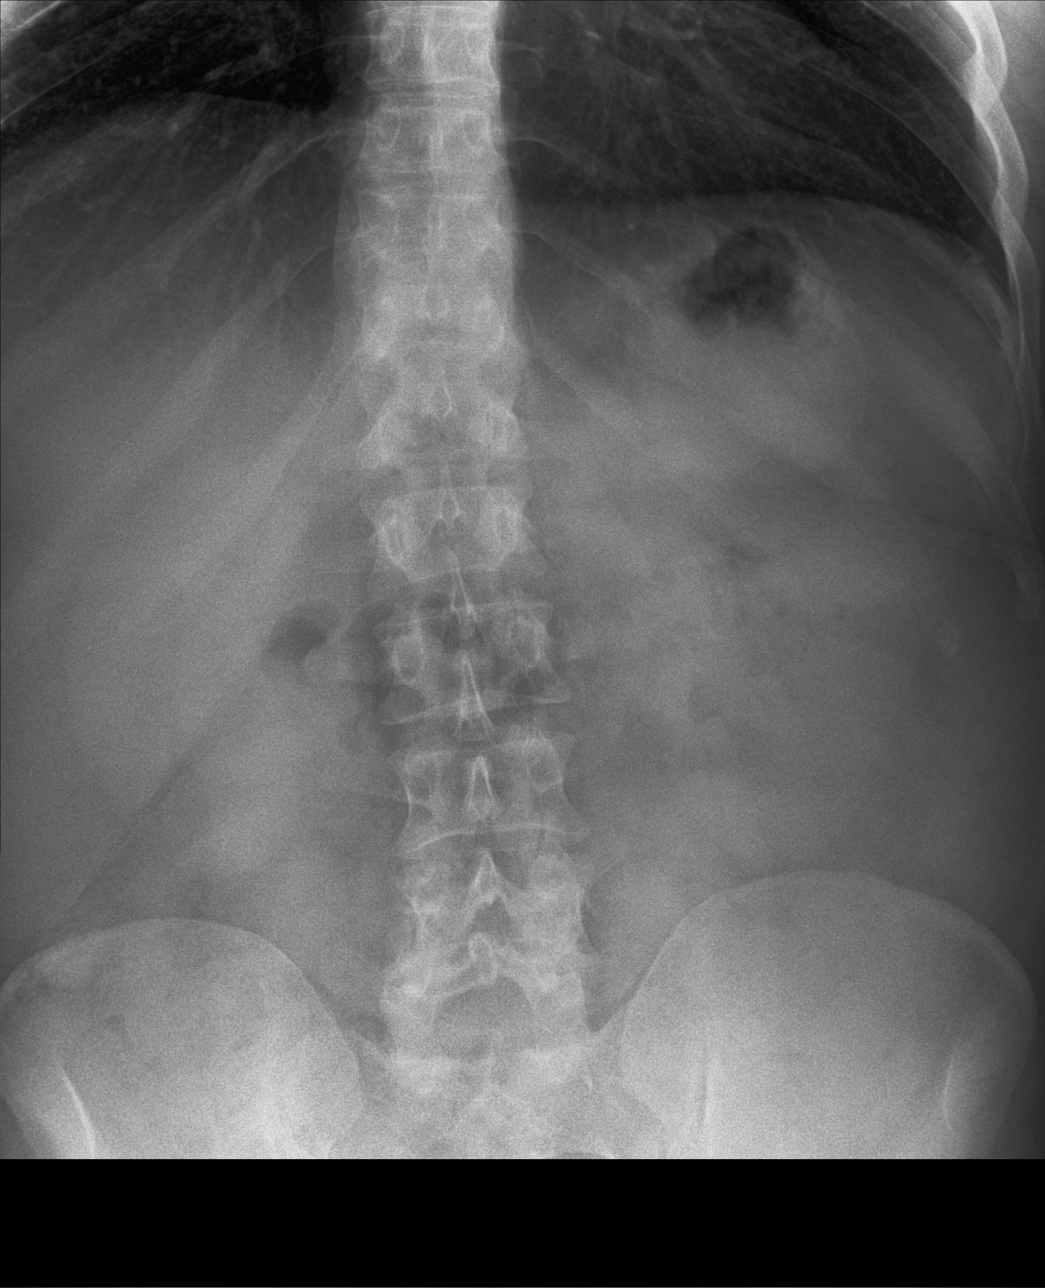

[abdomen supine (1 of 2)]
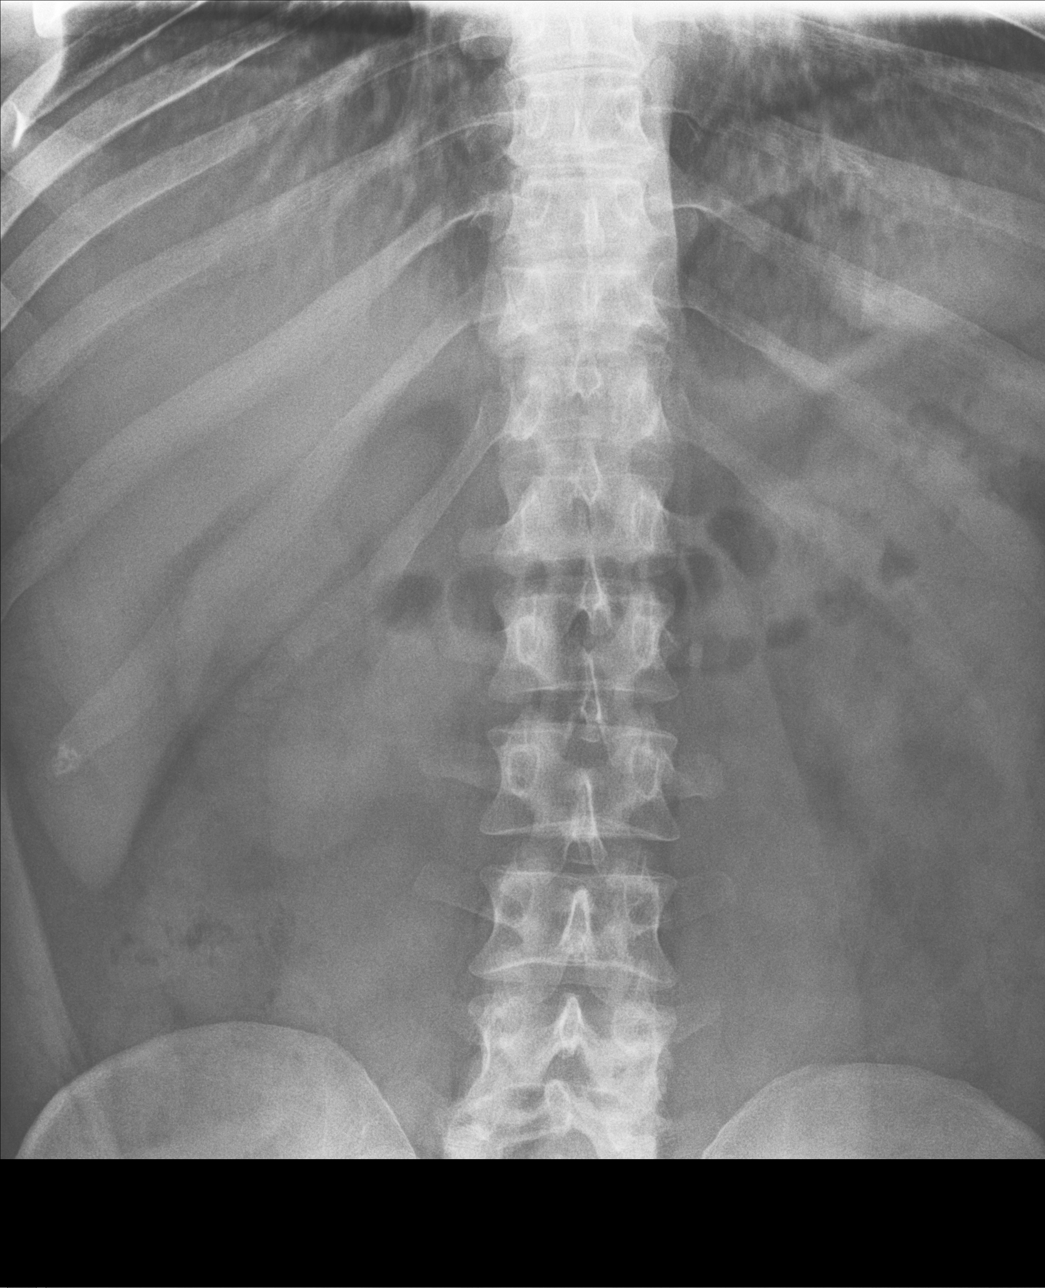

[abdomen supine (2 of 2)]
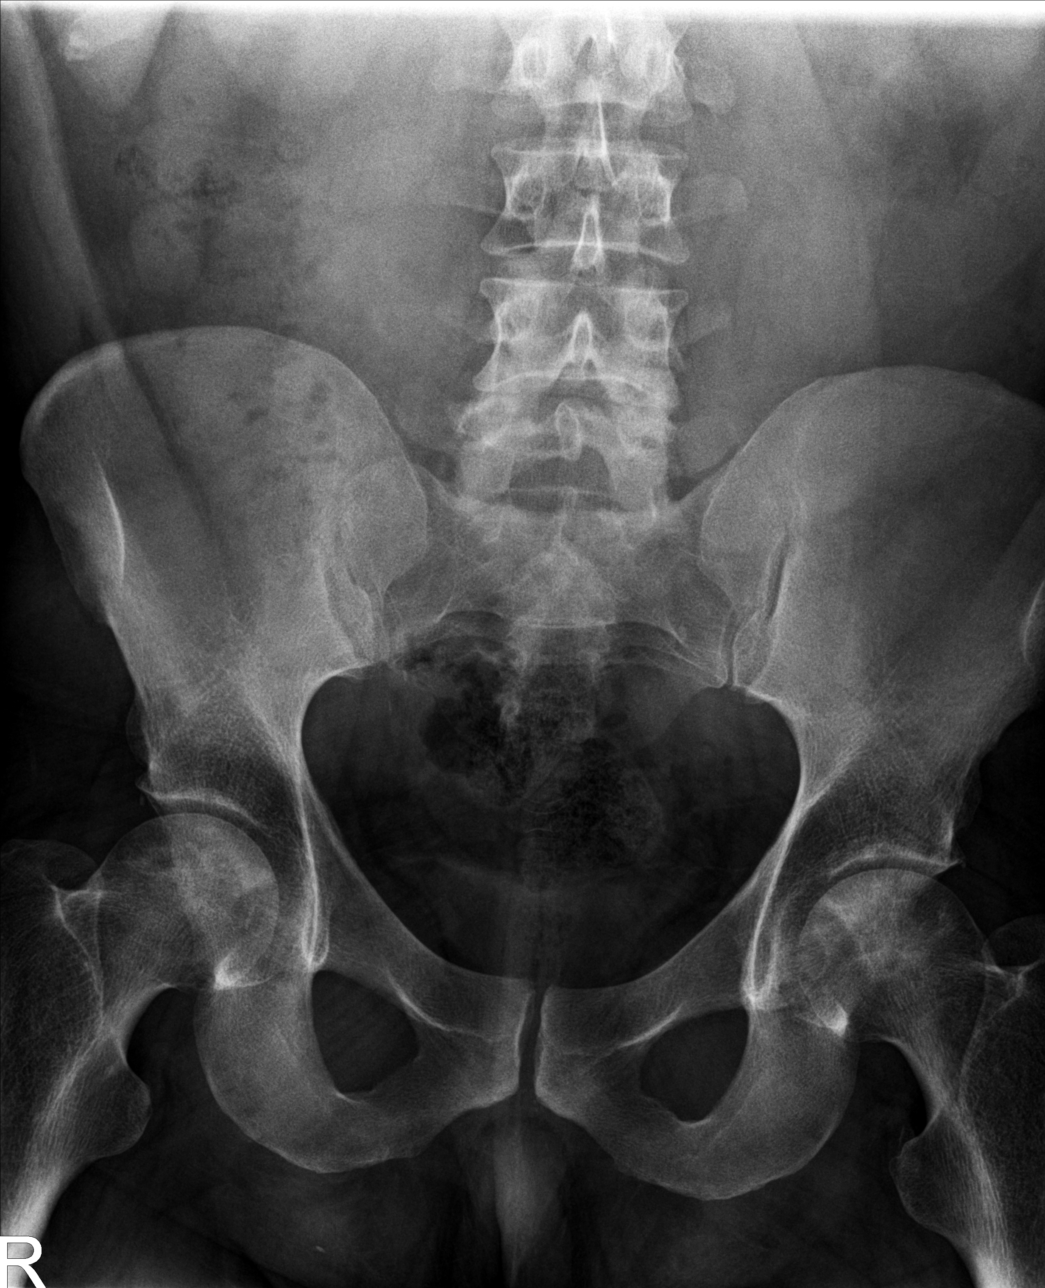

[4 of 4 positions shown; findings below may reference images not displayed]

FINDINGS: There is a paucity of bowel gas limiting evaluation but no evidence
of obstruction. The abdomen is otherwise normal in appearance.

Sclerosis in the femoral heads bilaterally may represent AVN. No
other bony abnormalities.

The chest is unremarkable.
IMPRESSION: 1. No cause for the patient's symptoms identified.
2. Sclerosis in the femoral head suggesting AVN.

## 2021-09-30 ENCOUNTER — Other Ambulatory Visit: Payer: Self-pay | Admitting: Family Medicine

## 2021-10-21 ENCOUNTER — Telehealth: Payer: Self-pay | Admitting: Family Medicine

## 2021-10-21 NOTE — Telephone Encounter (Signed)
Patient aware to call a sleep study in [place]

## 2021-10-22 NOTE — Telephone Encounter (Signed)
REFERRAL REQUEST Telephone Note  Have you been seen at our office for this problem? no (Advise that they may need an appointment with their PCP before a referral can be done)  Reason for Referral: sleep study Referral discussed with patient: yes  Best contact number of patient for referral team: [phone]    Has patient been seen by a specialist for this issue before: no  Patient provider preference for referral: Dr Cassandria Santee in [facility] phone [phone] fax [phone] Patient location preference for referral: Dr Cassandria Santee in [place] phone [phone] fax [phone] Spouse called Darl Pikes at Dr Otila Kluver office to scheduled an apt but was told to call pcp for a referral.    Patient notified that referrals can take up to a week or longer to process. If they haven't heard anything within a week they should call back and speak with the referral department.

## 2021-10-23 ENCOUNTER — Other Ambulatory Visit: Payer: Self-pay | Admitting: Family Medicine

## 2021-10-23 DIAGNOSIS — R0683 Snoring: Secondary | ICD-10-CM

## 2021-10-23 NOTE — Telephone Encounter (Signed)
Referral placed, as requested WS 

## 2021-10-24 ENCOUNTER — Telehealth: Payer: Self-pay | Admitting: Family Medicine

## 2021-10-24 NOTE — Telephone Encounter (Signed)
Calling to get clarification on referral

## 2021-10-30 NOTE — Telephone Encounter (Signed)
See if he has been able to Connect With the [facility] sleep lab.

## 2021-11-05 NOTE — Telephone Encounter (Signed)
Per wife patient has appointment in January.

## 2021-12-08 ENCOUNTER — Other Ambulatory Visit: Payer: Self-pay | Admitting: Family Medicine

## 2021-12-08 DIAGNOSIS — F411 Generalized anxiety disorder: Secondary | ICD-10-CM

## 2021-12-26 ENCOUNTER — Ambulatory Visit: Payer: No Typology Code available for payment source | Admitting: Family Medicine

## 2021-12-26 ENCOUNTER — Encounter: Payer: Self-pay | Admitting: Family Medicine

## 2021-12-26 VITALS — BP 140/77 | HR 82 | Temp 98.3°F | Ht 71.0 in | Wt 228.6 lb

## 2021-12-26 DIAGNOSIS — F411 Generalized anxiety disorder: Secondary | ICD-10-CM

## 2021-12-26 DIAGNOSIS — I10 Essential (primary) hypertension: Secondary | ICD-10-CM | POA: Diagnosis not present

## 2021-12-26 DIAGNOSIS — Z13220 Encounter for screening for lipoid disorders: Secondary | ICD-10-CM | POA: Diagnosis not present

## 2021-12-26 MED ORDER — CLONAZEPAM 1 MG PO TABS: ORAL_TABLET | ORAL | 5 refills | Status: DC

## 2021-12-26 MED ORDER — POTASSIUM CHLORIDE CRYS ER 20 MEQ PO TBCR: 20.0000 meq | EXTENDED_RELEASE_TABLET | Freq: Every day | ORAL | 3 refills | Status: DC

## 2021-12-26 MED ORDER — ALLOPURINOL 100 MG PO TABS: 100.0000 mg | ORAL_TABLET | Freq: Every day | ORAL | 3 refills | Status: DC

## 2021-12-26 MED ORDER — VALSARTAN 320 MG PO TABS: 320.0000 mg | ORAL_TABLET | Freq: Every day | ORAL | 3 refills | Status: DC

## 2021-12-26 MED ORDER — FUROSEMIDE 20 MG PO TABS: 20.0000 mg | ORAL_TABLET | Freq: Every day | ORAL | 1 refills | Status: DC

## 2021-12-26 NOTE — Progress Notes (Signed)
Subjective:  Patient ID: Philip Shepard, male    DOB: Jan 10, 1976  Age: 46 y.o. MRN: [id number]  CC: Medical Management of Chronic Issues   HPI Philip Shepard presents for  follow-up of hypertension. Patient has no history of headache chest pain or shortness of breath or recent cough. Patient also denies symptoms of TIA such as focal numbness or weakness. Patient denies side effects from medication. States taking it regularly.  Philip Shepard reports that his mother recently passed away.  His son, age 38, saw her die in [place] home.  He has been extremely anxious as a result.  This has caused Philip Shepard's anxiety to rise as well.   History Philip Shepard has a past medical history of Anxiety, Hypertension, and Sleep disorder.   He has a past surgical history that includes Knee surgery (Left); carpel tunnel left and right; Kidney stone surgery; and Tonsillectomy and adenoidectomy.   His family history includes Arthritis in his father and mother; Cancer in his mother and paternal grandmother; Diabetes in his mother.He reports that he has been smoking cigarettes. He quit smokeless tobacco use about 5 years ago. He reports that he does not drink alcohol and does not use drugs.  Current Outpatient Medications on File Prior to Visit  Medication Sig Dispense Refill   diltiazem (CARDIZEM CD) 360 MG 24 hr capsule Take 1 capsule (360 mg total) by mouth daily.  11   No current facility-administered medications on file prior to visit.    ROS Review of Systems  Constitutional:  Negative for fever.  Respiratory:  Negative for shortness of breath.   Cardiovascular:  Negative for chest pain.  Musculoskeletal:  Negative for arthralgias.  Skin:  Negative for rash.   Objective:  BP 140/77    Pulse 82    Temp 98.3 F (36.8 C)    Ht 5' 11"  (1.803 m)    Wt 228 lb 9.6 oz (103.7 kg)    SpO2 98%    BMI 31.88 kg/m   BP Readings from Last 3 Encounters:  12/26/21 140/77  05/22/21 136/83  04/18/21 128/81    Wt Readings from Last 3  Encounters:  12/26/21 228 lb 9.6 oz (103.7 kg)  05/22/21 247 lb (112 kg)  04/18/21 243 lb 12.8 oz (110.6 kg)     Physical Exam Vitals reviewed.  Constitutional:      Appearance: He is well-developed.  HENT:     Head: Normocephalic and atraumatic.     Right Ear: External ear normal.     Left Ear: External ear normal.     Mouth/Throat:     Pharynx: No oropharyngeal exudate or posterior oropharyngeal erythema.  Eyes:     Pupils: Pupils are equal, round, and reactive to light.  Cardiovascular:     Rate and Rhythm: Normal rate and regular rhythm.     Heart sounds: No murmur heard. Pulmonary:     Effort: No respiratory distress.     Breath sounds: Normal breath sounds.  Musculoskeletal:     Cervical back: Normal range of motion and neck supple.  Neurological:     Mental Status: He is alert and oriented to person, place, and time.      Assessment & Plan:   [name] was seen today for medical management of chronic issues.  Diagnoses and all orders for this visit:  Essential hypertension -     CBC with Differential/Platelet -     CMP14+EGFR  Lipid screening -     Lipid panel  Generalized anxiety  disorder -     clonazePAM (KLONOPIN) 1 MG tablet; TAKE 1 AND 1/2 TABLETS DAILY BY MOUTH AT BEDTIME  Other orders -     allopurinol (ZYLOPRIM) 100 MG tablet; Take 1 tablet (100 mg total) by mouth daily. -     furosemide (LASIX) 20 MG tablet; Take 1 tablet (20 mg total) by mouth daily. For swelling -     potassium chloride SA (KLOR-CON M) 20 MEQ tablet; Take 1 tablet (20 mEq total) by mouth daily. For potassium replacement/ supplement -     valsartan (DIOVAN) 320 MG tablet; Take 1 tablet (320 mg total) by mouth daily. for blood pressure   Allergies as of 12/26/2021       Reactions   Penicillins    Prednisone    Rage, allergic reaction        Medication List        Accurate as of December 26, 2021 11:59 PM. If you have any questions, ask your nurse or doctor.           allopurinol 100 MG tablet Commonly known as: ZYLOPRIM Take 1 tablet (100 mg total) by mouth daily.   clonazePAM 1 MG tablet Commonly known as: KLONOPIN TAKE 1 AND 1/2 TABLETS DAILY BY MOUTH AT BEDTIME   diltiazem 360 MG 24 hr capsule Commonly known as: CARDIZEM CD Take 1 capsule (360 mg total) by mouth daily.   furosemide 20 MG tablet Commonly known as: LASIX Take 1 tablet (20 mg total) by mouth daily. For swelling What changed:  medication strength how much to take Changed by: Claretta Fraise, MD   potassium chloride SA 20 MEQ tablet Commonly known as: KLOR-CON M Take 1 tablet (20 mEq total) by mouth daily. For potassium replacement/ supplement   valsartan 320 MG tablet Commonly known as: DIOVAN Take 1 tablet (320 mg total) by mouth daily. for blood pressure        Meds ordered this encounter  Medications   allopurinol (ZYLOPRIM) 100 MG tablet    Sig: Take 1 tablet (100 mg total) by mouth daily.    Dispense:  90 tablet    Refill:  3   clonazePAM (KLONOPIN) 1 MG tablet    Sig: TAKE 1 AND 1/2 TABLETS DAILY BY MOUTH AT BEDTIME    Dispense:  45 tablet    Refill:  5   furosemide (LASIX) 20 MG tablet    Sig: Take 1 tablet (20 mg total) by mouth daily. For swelling    Dispense:  90 tablet    Refill:  1   potassium chloride SA (KLOR-CON M) 20 MEQ tablet    Sig: Take 1 tablet (20 mEq total) by mouth daily. For potassium replacement/ supplement    Dispense:  90 tablet    Refill:  3   valsartan (DIOVAN) 320 MG tablet    Sig: Take 1 tablet (320 mg total) by mouth daily. for blood pressure    Dispense:  90 tablet    Refill:  3      Follow-up: Return in about 6 months (around 06/25/2022) for [facility] physical.  Claretta Fraise, M.D.

## 2021-12-27 ENCOUNTER — Encounter: Payer: Self-pay | Admitting: Family Medicine

## 2021-12-27 LAB — LIPID PANEL
Chol/HDL Ratio: 4.4 ratio (ref 0.0–5.0)
Cholesterol, Total: 182 mg/dL (ref 100–199)
HDL: 41 mg/dL (ref >39)
LDL Chol Calc (NIH): 105 mg/dL — ABNORMAL HIGH (ref 0–99)
Triglycerides: 210 mg/dL — ABNORMAL HIGH (ref 0–149)
VLDL Cholesterol Cal: 36 mg/dL (ref 5–40)

## 2021-12-27 LAB — CBC WITH DIFFERENTIAL/PLATELET
Basophils Absolute: 0.1 10*3/uL (ref 0.0–0.2)
Basos: 1 %
EOS (ABSOLUTE): 0.1 10*3/uL (ref 0.0–0.4)
Eos: 2 %
Hematocrit: 48.3 % (ref 37.5–51.0)
Hemoglobin: 16.6 g/dL (ref 13.0–17.7)
Immature Grans (Abs): 0 10*3/uL (ref 0.0–0.1)
Immature Granulocytes: 0 %
Lymphocytes Absolute: 2.5 10*3/uL (ref 0.7–3.1)
Lymphs: 32 %
MCH: 31.6 pg (ref 26.6–33.0)
MCHC: 34.4 g/dL (ref 31.5–35.7)
MCV: 92 fL (ref 79–97)
Monocytes Absolute: 0.8 10*3/uL (ref 0.1–0.9)
Monocytes: 10 %
Neutrophils Absolute: 4.3 10*3/uL (ref 1.4–7.0)
Neutrophils: 55 %
Platelets: 275 10*3/uL (ref 150–450)
RBC: 5.25 x10E6/uL (ref 4.14–5.80)
RDW: 13 % (ref 11.6–15.4)
WBC: 7.8 10*3/uL (ref 3.4–10.8)

## 2021-12-27 LAB — CMP14+EGFR
ALT: 41 IU/L (ref 0–44)
AST: 23 IU/L (ref 0–40)
Albumin/Globulin Ratio: 2.2 (ref 1.2–2.2)
Albumin: 5.3 g/dL — ABNORMAL HIGH (ref 4.0–5.0)
Alkaline Phosphatase: 79 IU/L (ref 44–121)
BUN/Creatinine Ratio: 20 (ref 9–20)
BUN: 19 mg/dL (ref 6–24)
Bilirubin Total: 0.5 mg/dL (ref 0.0–1.2)
CO2: 26 mmol/L (ref 20–29)
Calcium: 10 mg/dL (ref 8.7–10.2)
Chloride: 102 mmol/L (ref 96–106)
Creatinine, Ser: 0.93 mg/dL (ref 0.76–1.27)
Globulin, Total: 2.4 g/dL (ref 1.5–4.5)
Glucose: 80 mg/dL (ref 70–99)
Potassium: 4.7 mmol/L (ref 3.5–5.2)
Sodium: 140 mmol/L (ref 134–144)
Total Protein: 7.7 g/dL (ref 6.0–8.5)
eGFR: 104 mL/min/{1.73_m2} (ref >59)

## 2022-01-24 ENCOUNTER — Other Ambulatory Visit: Payer: Self-pay | Admitting: Family Medicine

## 2022-05-13 ENCOUNTER — Other Ambulatory Visit: Payer: Self-pay | Admitting: Family Medicine

## 2022-06-03 DIAGNOSIS — G4733 Obstructive sleep apnea (adult) (pediatric): Secondary | ICD-10-CM | POA: Diagnosis not present

## 2022-06-04 ENCOUNTER — Other Ambulatory Visit: Payer: Self-pay | Admitting: Family Medicine

## 2022-06-05 ENCOUNTER — Other Ambulatory Visit: Payer: Self-pay | Admitting: Family Medicine

## 2022-06-12 DIAGNOSIS — G4733 Obstructive sleep apnea (adult) (pediatric): Secondary | ICD-10-CM | POA: Diagnosis not present

## 2022-06-18 ENCOUNTER — Other Ambulatory Visit: Payer: Self-pay | Admitting: Family Medicine

## 2022-06-18 DIAGNOSIS — F411 Generalized anxiety disorder: Secondary | ICD-10-CM

## 2022-07-04 DIAGNOSIS — G4733 Obstructive sleep apnea (adult) (pediatric): Secondary | ICD-10-CM | POA: Diagnosis not present

## 2022-07-10 ENCOUNTER — Encounter: Payer: Self-pay | Admitting: Family Medicine

## 2022-07-10 ENCOUNTER — Ambulatory Visit: Payer: BC Managed Care – PPO | Admitting: Family Medicine

## 2022-07-10 VITALS — BP 145/81 | HR 103 | Temp 98.6°F | Ht 71.0 in | Wt 227.8 lb

## 2022-07-10 DIAGNOSIS — Z13220 Encounter for screening for lipoid disorders: Secondary | ICD-10-CM

## 2022-07-10 DIAGNOSIS — Z79899 Other long term (current) drug therapy: Secondary | ICD-10-CM | POA: Diagnosis not present

## 2022-07-10 DIAGNOSIS — F411 Generalized anxiety disorder: Secondary | ICD-10-CM | POA: Diagnosis not present

## 2022-07-10 DIAGNOSIS — I10 Essential (primary) hypertension: Secondary | ICD-10-CM

## 2022-07-10 MED ORDER — CLONAZEPAM 1 MG PO TABS: ORAL_TABLET | ORAL | 5 refills | Status: DC

## 2022-07-10 MED ORDER — FUROSEMIDE 20 MG PO TABS
20.0000 mg | ORAL_TABLET | Freq: Every day | ORAL | 3 refills | Status: DC
Start: 2022-07-10 — End: 2022-11-10

## 2022-07-10 MED ORDER — DILTIAZEM HCL ER COATED BEADS 360 MG PO CP24
360.0000 mg | ORAL_CAPSULE | Freq: Every day | ORAL | 3 refills | Status: DC
Start: 2022-07-10 — End: 2023-05-20

## 2022-07-10 NOTE — Progress Notes (Signed)
Subjective:  Patient ID: Philip Shepard, male    DOB: November 17, 1976  Age: 46 y.o. MRN: [id number]  CC: Medical Management of Chronic Issues   HPI Philip Shepard presents for  follow-up of hypertension. Patient has no history of headache chest pain or shortness of breath or recent cough. Patient also denies symptoms of TIA such as focal numbness or weakness. Patient denies side effects from medication. States taking it regularly. Bp AT HOME 131/60, 109/60 LAST NIGHT. Similar usually.    07/10/2022    3:02 PM 12/26/2021    2:13 PM 05/22/2021    3:47 PM 04/18/2021    3:38 PM 03/22/2021    8:49 AM  Depression screen PHQ 2/9  Decreased Interest 0 0 0 0 0  Down, Depressed, Hopeless 0 0 0 0 0  PHQ - 2 Score 0 0 0 0 0   istory Philip Shepard has a past medical history of Anxiety, Hypertension, and Sleep disorder.   He has a past surgical history that includes Knee surgery (Left); carpel tunnel left and right; Kidney stone surgery; and Tonsillectomy and adenoidectomy.   His family history includes Arthritis in his father and mother; Cancer in his mother and paternal grandmother; Diabetes in his mother.He reports that he has been smoking cigarettes. He quit smokeless tobacco use about 5 years ago. He reports that he does not drink alcohol and does not use drugs.  Current Outpatient Medications on File Prior to Visit  Medication Sig Dispense Refill   allopurinol (ZYLOPRIM) 100 MG tablet Take 1 tablet (100 mg total) by mouth daily. 90 tablet 3   valsartan (DIOVAN) 320 MG tablet Take 1 tablet (320 mg total) by mouth daily. for blood pressure 90 tablet 3   No current facility-administered medications on file prior to visit.    ROS Review of Systems  Constitutional:  Negative for fever.  Respiratory:  Negative for shortness of breath.   Cardiovascular:  Negative for chest pain.  Musculoskeletal:  Negative for arthralgias.  Skin:  Negative for rash.    Objective:  BP (!) 145/81   Pulse (!) 103   Temp 98.6  F (37 C)   Ht 5' 11"  (1.803 m)   Wt 227 lb 12.8 oz (103.3 kg)   SpO2 96%   BMI 31.77 kg/m   BP Readings from Last 3 Encounters:  07/10/22 (!) 145/81  12/26/21 140/77  05/22/21 136/83    Wt Readings from Last 3 Encounters:  07/10/22 227 lb 12.8 oz (103.3 kg)  12/26/21 228 lb 9.6 oz (103.7 kg)  05/22/21 247 lb (112 kg)     Physical Exam Vitals reviewed.  Constitutional:      Appearance: He is well-developed.  HENT:     Head: Normocephalic and atraumatic.     Right Ear: External ear normal.     Left Ear: External ear normal.     Mouth/Throat:     Pharynx: No oropharyngeal exudate or posterior oropharyngeal erythema.  Eyes:     Pupils: Pupils are equal, round, and reactive to light.  Cardiovascular:     Rate and Rhythm: Normal rate and regular rhythm.     Heart sounds: No murmur heard. Pulmonary:     Effort: No respiratory distress.     Breath sounds: Normal breath sounds.  Musculoskeletal:     Cervical back: Normal range of motion and neck supple.  Neurological:     Mental Status: He is alert and oriented to person, place, and time.  Assessment & Plan:   Philip Shepard was seen today for medical management of chronic issues.  Diagnoses and all orders for this visit:  Essential hypertension -     CBC with Differential/Platelet -     CMP14+EGFR  Generalized anxiety disorder -     clonazePAM (KLONOPIN) 1 MG tablet; TAKE 1 AND 1/2 TABLETS DAILY BY MOUTH AT BEDTIME  Controlled substance agreement signed -     Cancel: ToxASSURE Select 13 (MW), Urine -     ToxASSURE Select 13 (MW), Urine  Lipid screening -     Lipid panel  Other orders -     diltiazem (CARDIZEM CD) 360 MG 24 hr capsule; Take 1 capsule (360 mg total) by mouth daily. -     furosemide (LASIX) 20 MG tablet; Take 1 tablet (20 mg total) by mouth daily. For swelling   Allergies as of 07/10/2022       Reactions   Penicillins    Prednisone    Rage, allergic reaction        Medication List         Accurate as of July 10, 2022 11:59 PM. If you have any questions, ask your nurse or doctor.          STOP taking these medications    potassium chloride SA 20 MEQ tablet Commonly known as: KLOR-CON M Stopped by: Claretta Fraise, MD       TAKE these medications    allopurinol 100 MG tablet Commonly known as: ZYLOPRIM Take 1 tablet (100 mg total) by mouth daily.   clonazePAM 1 MG tablet Commonly known as: KLONOPIN TAKE 1 AND 1/2 TABLETS DAILY BY MOUTH AT BEDTIME   diltiazem 360 MG 24 hr capsule Commonly known as: CARDIZEM CD Take 1 capsule (360 mg total) by mouth daily. What changed: additional instructions Changed by: Claretta Fraise, MD   furosemide 20 MG tablet Commonly known as: LASIX Take 1 tablet (20 mg total) by mouth daily. For swelling   valsartan 320 MG tablet Commonly known as: DIOVAN Take 1 tablet (320 mg total) by mouth daily. for blood pressure        Meds ordered this encounter  Medications   clonazePAM (KLONOPIN) 1 MG tablet    Sig: TAKE 1 AND 1/2 TABLETS DAILY BY MOUTH AT BEDTIME    Dispense:  45 tablet    Refill:  5   diltiazem (CARDIZEM CD) 360 MG 24 hr capsule    Sig: Take 1 capsule (360 mg total) by mouth daily.    Dispense:  90 capsule    Refill:  3    This prescription was filled on 02/22/2022. Any refills authorized will be placed on file.   furosemide (LASIX) 20 MG tablet    Sig: Take 1 tablet (20 mg total) by mouth daily. For swelling    Dispense:  90 tablet    Refill:  3      Follow-up: Return in about 6 months (around 01/10/2023), or if symptoms worsen or fail to improve.  Claretta Fraise, M.D.

## 2022-07-11 LAB — CBC WITH DIFFERENTIAL/PLATELET
Basophils Absolute: 0.1 10*3/uL (ref 0.0–0.2)
Basos: 1 %
EOS (ABSOLUTE): 0.1 10*3/uL (ref 0.0–0.4)
Eos: 1 %
Hematocrit: 46.6 % (ref 37.5–51.0)
Hemoglobin: 16.6 g/dL (ref 13.0–17.7)
Immature Grans (Abs): 0 10*3/uL (ref 0.0–0.1)
Immature Granulocytes: 0 %
Lymphocytes Absolute: 2.8 10*3/uL (ref 0.7–3.1)
Lymphs: 29 %
MCH: 32.4 pg (ref 26.6–33.0)
MCHC: 35.6 g/dL (ref 31.5–35.7)
MCV: 91 fL (ref 79–97)
Monocytes Absolute: 0.8 10*3/uL (ref 0.1–0.9)
Monocytes: 9 %
Neutrophils Absolute: 5.8 10*3/uL (ref 1.4–7.0)
Neutrophils: 60 %
Platelets: 259 10*3/uL (ref 150–450)
RBC: 5.12 x10E6/uL (ref 4.14–5.80)
RDW: 13.4 % (ref 11.6–15.4)
WBC: 9.6 10*3/uL (ref 3.4–10.8)

## 2022-07-11 LAB — CMP14+EGFR
ALT: 38 IU/L (ref 0–44)
AST: 25 IU/L (ref 0–40)
Albumin/Globulin Ratio: 2.2 (ref 1.2–2.2)
Albumin: 5.2 g/dL — ABNORMAL HIGH (ref 4.1–5.1)
Alkaline Phosphatase: 79 IU/L (ref 44–121)
BUN/Creatinine Ratio: 13 (ref 9–20)
BUN: 12 mg/dL (ref 6–24)
Bilirubin Total: 0.7 mg/dL (ref 0.0–1.2)
CO2: 24 mmol/L (ref 20–29)
Calcium: 10.2 mg/dL (ref 8.7–10.2)
Chloride: 102 mmol/L (ref 96–106)
Creatinine, Ser: 0.92 mg/dL (ref 0.76–1.27)
Globulin, Total: 2.4 g/dL (ref 1.5–4.5)
Glucose: 100 mg/dL — ABNORMAL HIGH (ref 70–99)
Potassium: 4.1 mmol/L (ref 3.5–5.2)
Sodium: 142 mmol/L (ref 134–144)
Total Protein: 7.6 g/dL (ref 6.0–8.5)
eGFR: 106 mL/min/{1.73_m2} (ref >59)

## 2022-07-11 LAB — LIPID PANEL
Chol/HDL Ratio: 5.3 ratio — ABNORMAL HIGH (ref 0.0–5.0)
Cholesterol, Total: 210 mg/dL — ABNORMAL HIGH (ref 100–199)
HDL: 40 mg/dL (ref >39)
LDL Chol Calc (NIH): 132 mg/dL — ABNORMAL HIGH (ref 0–99)
Triglycerides: 214 mg/dL — ABNORMAL HIGH (ref 0–149)
VLDL Cholesterol Cal: 38 mg/dL (ref 5–40)

## 2022-07-13 ENCOUNTER — Encounter: Payer: Self-pay | Admitting: Family Medicine

## 2022-07-13 DIAGNOSIS — G4733 Obstructive sleep apnea (adult) (pediatric): Secondary | ICD-10-CM | POA: Diagnosis not present

## 2022-07-14 MED ORDER — MUPIROCIN 2 % EX OINT: TOPICAL_OINTMENT | CUTANEOUS | 5 refills | Status: DC

## 2022-07-14 NOTE — Addendum Note (Signed)
Addended by: Mechele Claude on: 07/14/2022 03:10 PM   Modules accepted: Orders

## 2022-07-15 LAB — TOXASSURE SELECT 13 (MW), URINE

## 2022-07-23 NOTE — Progress Notes (Signed)
Pt. Was notified of results and plan of action. HE will need to do a Toxassure in 3 months. It will need to be negative to continue getting controlled substances prescibed by [facility] providers [facility]

## 2022-08-04 DIAGNOSIS — G4733 Obstructive sleep apnea (adult) (pediatric): Secondary | ICD-10-CM | POA: Diagnosis not present

## 2022-08-13 DIAGNOSIS — G4733 Obstructive sleep apnea (adult) (pediatric): Secondary | ICD-10-CM | POA: Diagnosis not present

## 2022-08-14 DIAGNOSIS — Z87442 Personal history of urinary calculi: Secondary | ICD-10-CM | POA: Diagnosis not present

## 2022-08-14 DIAGNOSIS — K429 Umbilical hernia without obstruction or gangrene: Secondary | ICD-10-CM | POA: Diagnosis not present

## 2022-08-14 DIAGNOSIS — K5900 Constipation, unspecified: Secondary | ICD-10-CM | POA: Diagnosis not present

## 2022-08-14 DIAGNOSIS — K5730 Diverticulosis of large intestine without perforation or abscess without bleeding: Secondary | ICD-10-CM | POA: Diagnosis not present

## 2022-08-14 DIAGNOSIS — N400 Enlarged prostate without lower urinary tract symptoms: Secondary | ICD-10-CM | POA: Diagnosis not present

## 2022-09-03 DIAGNOSIS — G4733 Obstructive sleep apnea (adult) (pediatric): Secondary | ICD-10-CM | POA: Diagnosis not present

## 2022-10-14 ENCOUNTER — Ambulatory Visit: Payer: BC Managed Care – PPO | Admitting: Family Medicine

## 2022-10-27 DIAGNOSIS — G4733 Obstructive sleep apnea (adult) (pediatric): Secondary | ICD-10-CM | POA: Diagnosis not present

## 2022-11-10 ENCOUNTER — Ambulatory Visit: Payer: BC Managed Care – PPO | Admitting: Family Medicine

## 2022-11-10 ENCOUNTER — Encounter: Payer: Self-pay | Admitting: Family Medicine

## 2022-11-10 VITALS — BP 127/73 | HR 95 | Temp 99.2°F | Ht 71.0 in | Wt 233.0 lb

## 2022-11-10 DIAGNOSIS — I10 Essential (primary) hypertension: Secondary | ICD-10-CM

## 2022-11-10 DIAGNOSIS — Z79899 Other long term (current) drug therapy: Secondary | ICD-10-CM

## 2022-11-10 DIAGNOSIS — F411 Generalized anxiety disorder: Secondary | ICD-10-CM | POA: Diagnosis not present

## 2022-11-10 DIAGNOSIS — N200 Calculus of kidney: Secondary | ICD-10-CM | POA: Diagnosis not present

## 2022-11-10 MED ORDER — LEVOFLOXACIN 500 MG PO TABS: 500.0000 mg | ORAL_TABLET | Freq: Every day | ORAL | 0 refills | Status: DC

## 2022-11-10 NOTE — Addendum Note (Signed)
Addended by: Mechele Claude on: 11/10/2022 04:55 PM   Modules accepted: Orders

## 2022-11-10 NOTE — Progress Notes (Addendum)
Subjective:  Patient ID: Philip Shepard, male    DOB: September 05, 1976  Age: 46 y.o. MRN: [id number]  CC: Medical Management of Chronic Issues   HPI Philip Shepard presents for  follow-up of hypertension. Patient has no history of headache chest pain or shortness of breath or recent cough. Patient also denies symptoms of TIA such as focal numbness or weakness. Patient denies side effects from medication. States taking it regularly. Doing pretty well at home. But daughter, a new nurse has caught it running high a couple of times at home.   Also here for retest of urine tox screen due to positive for THC last time. Claims it was CBD Gummies.  This also casused him to have two kidney stones.  Symptoms include congestion, facial pain, nasal congestion, non productive cough, post nasal drip and sinus pressure. There is no fever, chills, or sweats. Onset of symptoms was a few days ago, gradually worsening since that time.    High level of stress related to father in law's cancer treatments.Already deals with GAD.     11/10/2022    4:10 PM 03/19/2018    3:17 PM 02/22/2016    1:49 PM  GAD 7 : Generalized Anxiety Score  Nervous, Anxious, on Edge 0 0 3  Control/stop worrying 0 0 3  Worry too much - different things 0 0 3  Trouble relaxing 0 0 3  Restless 0 0 3  Easily annoyed or irritable 0 0 3  Afraid - awful might happen 0 0 0  Total GAD 7 Score 0 0 18  Anxiety Difficulty   Somewhat difficult    Although above is noted, pt. Relates in interview a high level of stress and being on edge.  He cannot stop worrying. Can't relax. Never sits down.   History Tkai has a past medical history of Anxiety, Hypertension, and Sleep disorder.   He has a past surgical history that includes Knee surgery (Left); carpel tunnel left and right; Kidney stone surgery; and Tonsillectomy and adenoidectomy.   His family history includes Arthritis in his father and mother; Cancer in his mother and paternal grandmother;  Diabetes in his mother.He reports that he has been smoking cigarettes. He quit smokeless tobacco use about 6 years ago. He reports that he does not drink alcohol and does not use drugs.  Current Outpatient Medications on File Prior to Visit  Medication Sig Dispense Refill   allopurinol (ZYLOPRIM) 100 MG tablet Take 1 tablet (100 mg total) by mouth daily. 90 tablet 3   clonazePAM (KLONOPIN) 1 MG tablet TAKE 1 AND 1/2 TABLETS DAILY BY MOUTH AT BEDTIME 45 tablet 5   diltiazem (CARDIZEM CD) 360 MG 24 hr capsule Take 1 capsule (360 mg total) by mouth daily. 90 capsule 3   valsartan (DIOVAN) 320 MG tablet Take 1 tablet (320 mg total) by mouth daily. for blood pressure 90 tablet 3   furosemide (LASIX) 20 MG tablet Take 1 tablet (20 mg total) by mouth daily. For swelling 90 tablet 3   No current facility-administered medications on file prior to visit.    ROS Review of Systems  Constitutional:  Negative for fever.  Respiratory:  Negative for shortness of breath.   Cardiovascular:  Negative for chest pain.  Musculoskeletal:  Negative for arthralgias.  Skin:  Negative for rash.    Objective:  BP 127/73   Pulse 95   Temp 99.2 F (37.3 C)   Ht 5\' 11"  (1.803 m)   Wt 233 lb (  105.7 kg)   SpO2 96%   BMI 32.50 kg/m   BP Readings from Last 3 Encounters:  11/10/22 127/73  07/10/22 (!) 145/81  12/26/21 140/77    Wt Readings from Last 3 Encounters:  11/10/22 233 lb (105.7 kg)  07/10/22 227 lb 12.8 oz (103.3 kg)  12/26/21 228 lb 9.6 oz (103.7 kg)     Physical Exam Vitals reviewed.  Constitutional:      Appearance: He is well-developed.  HENT:     Head: Normocephalic and atraumatic.     Right Ear: External ear normal.     Left Ear: External ear normal.     Mouth/Throat:     Pharynx: No oropharyngeal exudate or posterior oropharyngeal erythema.  Eyes:     Pupils: Pupils are equal, round, and reactive to light.  Cardiovascular:     Rate and Rhythm: Normal rate and regular rhythm.      Heart sounds: No murmur heard. Pulmonary:     Effort: No respiratory distress.     Breath sounds: Normal breath sounds.  Musculoskeletal:     Cervical back: Normal range of motion and neck supple.  Neurological:     Mental Status: He is alert and oriented to person, place, and time.       Assessment & Plan:   Donal was seen today for medical management of chronic issues.  Diagnoses and all orders for this visit:  Essential hypertension  Controlled substance agreement signed -     Cancel: ToxASSURE Select 13 (MW), Urine -     ToxASSURE Select 13 (MW), Urine  Generalized anxiety disorder  Nephrolithiasis   Allergies as of 11/10/2022       Reactions   Penicillins    Prednisone    Rage, allergic reaction        Medication List        Accurate as of November 10, 2022  4:41 PM. If you have any questions, ask your nurse or doctor.          STOP taking these medications    azithromycin 250 MG tablet Commonly known as: ZITHROMAX Stopped by: Mechele Claude, MD   HYDROcodone-acetaminophen 5-325 MG tablet Commonly known as: NORCO/VICODIN Stopped by: Mechele Claude, MD   ketorolac 10 MG tablet Commonly known as: TORADOL Stopped by: Mechele Claude, MD   tamsulosin 0.4 MG Caps capsule Commonly known as: FLOMAX Stopped by: Mechele Claude, MD       TAKE these medications    allopurinol 100 MG tablet Commonly known as: ZYLOPRIM Take 1 tablet (100 mg total) by mouth daily.   clonazePAM 1 MG tablet Commonly known as: KLONOPIN TAKE 1 AND 1/2 TABLETS DAILY BY MOUTH AT BEDTIME   diltiazem 360 MG 24 hr capsule Commonly known as: CARDIZEM CD Take 1 capsule (360 mg total) by mouth daily.   furosemide 20 MG tablet Commonly known as: LASIX Take 1 tablet (20 mg total) by mouth daily. For swelling   valsartan 320 MG tablet Commonly known as: DIOVAN Take 1 tablet (320 mg total) by mouth daily. for blood pressure        No orders of the defined types were  placed in this encounter.   I put clonazepam refills on hold until drug screen results are available.   Follow-up: Return in about 3 months (around 02/09/2023).  Mechele Claude, M.D.

## 2022-11-14 LAB — TOXASSURE SELECT 13 (MW), URINE

## 2022-12-10 ENCOUNTER — Other Ambulatory Visit: Payer: Self-pay | Admitting: Family Medicine

## 2022-12-10 DIAGNOSIS — F411 Generalized anxiety disorder: Secondary | ICD-10-CM

## 2022-12-10 MED ORDER — ALLOPURINOL 100 MG PO TABS: 100.0000 mg | ORAL_TABLET | Freq: Every day | ORAL | 3 refills | Status: DC

## 2022-12-10 MED ORDER — CLONAZEPAM 1 MG PO TABS: ORAL_TABLET | ORAL | 4 refills | Status: DC

## 2022-12-10 NOTE — Telephone Encounter (Signed)
  Prescription Request  12/10/2022  Is this a "Controlled Substance" medicine?   Have you seen your PCP in the last 2 weeks? Seen on 12/18 and was told they would be called in   If YES, route message to pool  -  If NO, patient needs to be scheduled for appointment.  What is the name of the medication or equipment? allopurinol (ZYLOPRIM) 100 MG tablet clonazePAM (KLONOPIN) 1 MG tablet  Have you contacted your pharmacy to request a refill? Yes    Which pharmacy would you like this sent to?  [place], [place]     Patient notified that their request is being sent to the clinical staff for review and that they should receive a response within 2 business days.

## 2022-12-11 NOTE — Telephone Encounter (Signed)
NA/VM full refills sent to pharmacy

## 2022-12-31 ENCOUNTER — Other Ambulatory Visit: Payer: Self-pay | Admitting: Family Medicine

## 2023-05-20 ENCOUNTER — Ambulatory Visit: Payer: BC Managed Care – PPO | Admitting: Family Medicine

## 2023-05-20 ENCOUNTER — Encounter: Payer: Self-pay | Admitting: Family Medicine

## 2023-05-20 VITALS — BP 130/72 | HR 97 | Temp 98.5°F | Ht 71.0 in | Wt 223.4 lb

## 2023-05-20 DIAGNOSIS — Z13220 Encounter for screening for lipoid disorders: Secondary | ICD-10-CM | POA: Diagnosis not present

## 2023-05-20 DIAGNOSIS — I10 Essential (primary) hypertension: Secondary | ICD-10-CM | POA: Diagnosis not present

## 2023-05-20 DIAGNOSIS — M7918 Myalgia, other site: Secondary | ICD-10-CM | POA: Diagnosis not present

## 2023-05-20 DIAGNOSIS — F411 Generalized anxiety disorder: Secondary | ICD-10-CM | POA: Diagnosis not present

## 2023-05-20 MED ORDER — VALSARTAN 320 MG PO TABS: 320.0000 mg | ORAL_TABLET | Freq: Every day | ORAL | 3 refills | Status: DC

## 2023-05-20 MED ORDER — BETAMETHASONE SOD PHOS & ACET 6 (3-3) MG/ML IJ SUSP
6.0000 mg | Freq: Once | INTRAMUSCULAR | Status: AC
Start: 2023-05-20 — End: 2023-05-20
  Administered 2023-05-20: 6 mg via INTRAMUSCULAR

## 2023-05-20 MED ORDER — DILTIAZEM HCL ER COATED BEADS 360 MG PO CP24: 360.0000 mg | ORAL_CAPSULE | Freq: Every day | ORAL | 3 refills | Status: DC

## 2023-05-20 MED ORDER — CLONAZEPAM 1 MG PO TABS
ORAL_TABLET | ORAL | 4 refills | Status: DC
Start: 2023-05-20 — End: 2023-12-09

## 2023-05-20 NOTE — Progress Notes (Signed)
Subjective:  Patient ID: Philip Shepard, male    DOB: [birth year]  Age: 47 y.o. MRN: [id number]  CC: Medical Management of Chronic Issues   HPI Philip Shepard presents for  presents for  follow-up of hypertension. Patient has no history of headache chest pain or shortness of breath or recent cough. Patient also denies symptoms of TIA such as focal numbness or weakness. Patient denies side effects from medication. States taking it regularly.  Pt. Working out hard, building muscle, excited about his progress. Helps with his anxiety level. Still taking clonazepam.     11/10/2022    4:10 PM 03/19/2018    3:17 PM 02/22/2016    1:49 PM  GAD 7 : Generalized Anxiety Score  Nervous, Anxious, on Edge 0 0 3  Control/stop worrying 0 0 3  Worry too much - different things 0 0 3  Trouble relaxing 0 0 3  Restless 0 0 3  Easily annoyed or irritable 0 0 3  Afraid - awful might happen 0 0 0  Total GAD 7 Score 0 0 18  Anxiety Difficulty   Somewhat difficult         05/20/2023    3:04 PM 11/10/2022    4:09 PM 11/10/2022    3:53 PM  Depression screen PHQ 2/9  Decreased Interest 0 1 0  Down, Depressed, Hopeless 0 0 0  PHQ - 2 Score 0 1 0  Altered sleeping  1   Tired, decreased energy  1   Change in appetite  0   Feeling bad or failure about yourself   0   Trouble concentrating  0   Moving slowly or fidgety/restless  0   Suicidal thoughts  0   PHQ-9 Score  3   Difficult doing work/chores  Somewhat difficult     History Philip Shepard has a past medical history of Anxiety, Hypertension, and Sleep disorder.   He has a past surgical history that includes Knee surgery (Left); carpel tunnel left and right; Kidney stone surgery; and Tonsillectomy and adenoidectomy.   His family history includes Arthritis in his father and mother; Cancer in his mother and paternal grandmother; Diabetes in his mother.He reports that he has been smoking cigarettes. He quit smokeless tobacco use about 6 years ago. He reports  that he does not drink alcohol and does not use drugs.    ROS Review of Systems  Constitutional:  Negative for fever.  Respiratory:  Negative for shortness of breath.   Cardiovascular:  Negative for chest pain.  Musculoskeletal:  Positive for myalgias (pain anterior & posteriorly at right deltoid. Some pain with abduction). Negative for arthralgias.  Skin:  Negative for rash.    Objective:  BP 130/72   Pulse 97   Temp 98.5 F (36.9 C)   Ht 5\' 11"  (1.803 m)   Wt 223 lb 6.4 oz (101.3 kg)   SpO2 98%   BMI 31.16 kg/m   BP Readings from Last 3 Encounters:  05/20/23 130/72  11/10/22 127/73  07/10/22 (!) 145/81    Wt Readings from Last 3 Encounters:  05/20/23 223 lb 6.4 oz (101.3 kg)  11/10/22 233 lb (105.7 kg)  07/10/22 227 lb 12.8 oz (103.3 kg)     Physical Exam Vitals reviewed.  Constitutional:      Appearance: He is well-developed.     Comments: Muscular physique   HENT:     Head: Normocephalic and atraumatic.     Right Ear: External ear normal.  Left Ear: External ear normal.     Mouth/Throat:     Pharynx: No oropharyngeal exudate or posterior oropharyngeal erythema.  Eyes:     Pupils: Pupils are equal, round, and reactive to light.  Cardiovascular:     Rate and Rhythm: Normal rate and regular rhythm.     Heart sounds: No murmur heard. Pulmonary:     Effort: No respiratory distress.     Breath sounds: Normal breath sounds.  Musculoskeletal:        General: Tenderness (mild at right deltoid) present.     Cervical back: Normal range of motion and neck supple.  Neurological:     Mental Status: He is alert and oriented to person, place, and time.       Assessment & Plan:   Philip Shepard was seen today for medical management of chronic issues.  Diagnoses and all orders for this visit:  Essential hypertension -     CMP14+EGFR -     CBC with Differential/Platelet  Lipid screening -     Lipid panel  Generalized anxiety disorder -     clonazePAM  (KLONOPIN) 1 MG tablet; TAKE 1 AND 1/2 TABLETS DAILY BY MOUTH AT BEDTIME  Pain of right deltoid -     betamethasone acetate-betamethasone sodium phosphate (CELESTONE) injection 6 mg  Other orders -     diltiazem (CARDIZEM CD) 360 MG 24 hr capsule; Take 1 capsule (360 mg total) by mouth daily. -     valsartan (DIOVAN) 320 MG tablet; Take 1 tablet (320 mg total) by mouth daily. for blood pressure       I have discontinued Philip Shepard's levofloxacin. I am also having him maintain his allopurinol, clonazePAM, diltiazem, and valsartan. We will continue to administer betamethasone acetate-betamethasone sodium phosphate.  Allergies as of 05/20/2023       Reactions   Penicillins    Prednisone    Rage, allergic reaction        Medication List        Accurate as of May 20, 2023  4:04 PM. If you have any questions, ask your nurse or doctor.          STOP taking these medications    levofloxacin 500 MG tablet Commonly known as: LEVAQUIN Stopped by: Mechele Claude, MD       TAKE these medications    allopurinol 100 MG tablet Commonly known as: ZYLOPRIM Take 1 tablet (100 mg total) by mouth daily.   clonazePAM 1 MG tablet Commonly known as: KLONOPIN TAKE 1 AND 1/2 TABLETS DAILY BY MOUTH AT BEDTIME   diltiazem 360 MG 24 hr capsule Commonly known as: CARDIZEM CD Take 1 capsule (360 mg total) by mouth daily.   valsartan 320 MG tablet Commonly known as: DIOVAN Take 1 tablet (320 mg total) by mouth daily. for blood pressure         Follow-up: Return in about 6 months (around 11/19/2023).  Mechele Claude, M.D.

## 2023-05-21 LAB — CMP14+EGFR
ALT: 31 IU/L (ref 0–44)
AST: 25 IU/L (ref 0–40)
Albumin: 5.1 g/dL (ref 4.1–5.1)
Alkaline Phosphatase: 87 IU/L (ref 44–121)
BUN/Creatinine Ratio: 11 (ref 9–20)
BUN: 10 mg/dL (ref 6–24)
Bilirubin Total: 0.6 mg/dL (ref 0.0–1.2)
CO2: 22 mmol/L (ref 20–29)
Calcium: 9.9 mg/dL (ref 8.7–10.2)
Chloride: 101 mmol/L (ref 96–106)
Creatinine, Ser: 0.95 mg/dL (ref 0.76–1.27)
Globulin, Total: 2.6 g/dL (ref 1.5–4.5)
Glucose: 98 mg/dL (ref 70–99)
Potassium: 4 mmol/L (ref 3.5–5.2)
Sodium: 141 mmol/L (ref 134–144)
Total Protein: 7.7 g/dL (ref 6.0–8.5)
eGFR: 101 mL/min/{1.73_m2} (ref >59)

## 2023-05-21 LAB — CBC WITH DIFFERENTIAL/PLATELET
Basophils Absolute: 0.1 10*3/uL (ref 0.0–0.2)
Basos: 1 %
EOS (ABSOLUTE): 0 10*3/uL (ref 0.0–0.4)
Eos: 0 %
Hematocrit: 44.1 % (ref 37.5–51.0)
Hemoglobin: 15.2 g/dL (ref 13.0–17.7)
Immature Grans (Abs): 0 10*3/uL (ref 0.0–0.1)
Immature Granulocytes: 0 %
Lymphocytes Absolute: 3.3 10*3/uL — ABNORMAL HIGH (ref 0.7–3.1)
Lymphs: 37 %
MCH: 32.1 pg (ref 26.6–33.0)
MCHC: 34.5 g/dL (ref 31.5–35.7)
MCV: 93 fL (ref 79–97)
Monocytes Absolute: 0.8 10*3/uL (ref 0.1–0.9)
Monocytes: 9 %
Neutrophils Absolute: 4.8 10*3/uL (ref 1.4–7.0)
Neutrophils: 53 %
Platelets: 279 10*3/uL (ref 150–450)
RBC: 4.73 x10E6/uL (ref 4.14–5.80)
RDW: 13.1 % (ref 11.6–15.4)
WBC: 9 10*3/uL (ref 3.4–10.8)

## 2023-05-21 LAB — LIPID PANEL
Chol/HDL Ratio: 3.7 ratio (ref 0.0–5.0)
Cholesterol, Total: 183 mg/dL (ref 100–199)
HDL: 49 mg/dL (ref >39)
LDL Chol Calc (NIH): 118 mg/dL — ABNORMAL HIGH (ref 0–99)
Triglycerides: 84 mg/dL (ref 0–149)
VLDL Cholesterol Cal: 16 mg/dL (ref 5–40)

## 2023-05-21 NOTE — Progress Notes (Signed)
Hello [name],  Your lab result is normal and/or stable.Some minor variations that are not significant are commonly marked abnormal, but do not represent any medical problem for you.  Best regards, Torin Whisner, M.D.

## 2023-06-22 ENCOUNTER — Other Ambulatory Visit: Payer: Self-pay | Admitting: Family Medicine

## 2023-08-08 DIAGNOSIS — M79644 Pain in right finger(s): Secondary | ICD-10-CM | POA: Diagnosis not present

## 2023-08-08 DIAGNOSIS — Z1330 Encounter for screening examination for mental health and behavioral disorders, unspecified: Secondary | ICD-10-CM | POA: Diagnosis not present

## 2023-08-08 DIAGNOSIS — M79641 Pain in right hand: Secondary | ICD-10-CM | POA: Diagnosis not present

## 2023-08-15 DIAGNOSIS — S6991XD Unspecified injury of right wrist, hand and finger(s), subsequent encounter: Secondary | ICD-10-CM | POA: Diagnosis not present

## 2023-08-15 DIAGNOSIS — S62632A Displaced fracture of distal phalanx of right middle finger, initial encounter for closed fracture: Secondary | ICD-10-CM | POA: Diagnosis not present

## 2023-11-13 ENCOUNTER — Telehealth: Payer: Self-pay | Admitting: Family Medicine

## 2023-11-16 ENCOUNTER — Ambulatory Visit: Payer: BC Managed Care – PPO | Admitting: Family Medicine

## 2023-11-16 ENCOUNTER — Other Ambulatory Visit: Payer: Self-pay | Admitting: Family Medicine

## 2023-11-16 DIAGNOSIS — F411 Generalized anxiety disorder: Secondary | ICD-10-CM

## 2023-11-23 ENCOUNTER — Encounter: Payer: Self-pay | Admitting: Family Medicine

## 2023-12-09 ENCOUNTER — Ambulatory Visit: Payer: BC Managed Care – PPO | Admitting: Family Medicine

## 2023-12-09 ENCOUNTER — Encounter: Payer: Self-pay | Admitting: Family Medicine

## 2023-12-09 VITALS — BP 123/70 | HR 91 | Temp 97.4°F | Ht 71.0 in | Wt 218.0 lb

## 2023-12-09 DIAGNOSIS — Z79899 Other long term (current) drug therapy: Secondary | ICD-10-CM | POA: Diagnosis not present

## 2023-12-09 DIAGNOSIS — R202 Paresthesia of skin: Secondary | ICD-10-CM

## 2023-12-09 DIAGNOSIS — M1A9XX0 Chronic gout, unspecified, without tophus (tophi): Secondary | ICD-10-CM | POA: Diagnosis not present

## 2023-12-09 DIAGNOSIS — L732 Hidradenitis suppurativa: Secondary | ICD-10-CM

## 2023-12-09 DIAGNOSIS — R200 Anesthesia of skin: Secondary | ICD-10-CM

## 2023-12-09 DIAGNOSIS — I10 Essential (primary) hypertension: Secondary | ICD-10-CM

## 2023-12-09 DIAGNOSIS — F411 Generalized anxiety disorder: Secondary | ICD-10-CM | POA: Diagnosis not present

## 2023-12-09 MED ORDER — MUPIROCIN 2 % EX OINT: TOPICAL_OINTMENT | CUTANEOUS | 5 refills | Status: AC

## 2023-12-09 MED ORDER — VALSARTAN 320 MG PO TABS: 320.0000 mg | ORAL_TABLET | Freq: Every day | ORAL | 3 refills | Status: AC

## 2023-12-09 MED ORDER — METHYLPREDNISOLONE 4 MG PO TABS: ORAL_TABLET | ORAL | 0 refills | Status: DC

## 2023-12-09 MED ORDER — CLONAZEPAM 1 MG PO TABS: ORAL_TABLET | ORAL | 4 refills | Status: DC

## 2023-12-09 MED ORDER — ALLOPURINOL 100 MG PO TABS: 100.0000 mg | ORAL_TABLET | Freq: Every day | ORAL | 3 refills | Status: AC

## 2023-12-09 NOTE — Progress Notes (Signed)
Subjective:  Patient ID: Philip Shepard, male    DOB: [birth year]  Age: 48 y.o. MRN: [id number]  CC: Hearing Problem (Muffled hearing in right ear. Ongoing a few months. ), Numbness (Last 3 digits go numb on left hand. Burning in elbow as well. Hard to grip and has to concentrate. ), Toe Pain (Big toe and index toe on left side are burning. On and off. Feels it more when sitting. Big toe previously injured. ), and Medication Refill (Pt reports needing all refills  and along with abx cream previously prescribed. )   HPI Alireza Olaiz presents for  follow-up of hypertension. Patient has no history of headache chest pain or shortness of breath or recent cough. Patient also denies symptoms of TIA such as focal numbness or weakness. Patient denies side effects from medication. States taking it regularly.  Left hand has tingling in fingers since injury to elbow  5.5 mos ago. Elbow burns and fingers 3-5 go numb. 3-4 times a week. Not losing grip.   History Mika has a past medical history of Anxiety, Hypertension, and Sleep disorder.   He has a past surgical history that includes Knee surgery (Left); carpel tunnel left and right; Kidney stone surgery; and Tonsillectomy and adenoidectomy.   His family history includes Arthritis in his father and mother; Cancer in his mother and paternal grandmother; Diabetes in his mother.He reports that he has been smoking cigarettes. He quit smokeless tobacco use about 7 years ago. He reports that he does not drink alcohol and does not use drugs.  Current Outpatient Medications on File Prior to Visit  Medication Sig Dispense Refill   diltiazem (CARDIZEM CD) 360 MG 24 hr capsule Take 1 capsule (360 mg total) by mouth daily. 90 capsule 3   No current facility-administered medications on file prior to visit.    ROS Review of Systems  Constitutional:  Negative for fever.  Respiratory:  Negative for shortness of breath.   Cardiovascular:  Negative for chest pain.   Musculoskeletal:  Negative for arthralgias and back pain.  Skin:  Negative for rash.    Objective:  BP 123/70   Pulse 91   Temp (!) 97.4 F (36.3 C) (Temporal)   Ht 5\' 11"  (1.803 m)   Wt 218 lb (98.9 kg)   SpO2 99%   BMI 30.40 kg/m   BP Readings from Last 3 Encounters:  12/09/23 123/70  05/20/23 130/72  11/10/22 127/73    Wt Readings from Last 3 Encounters:  12/09/23 218 lb (98.9 kg)  05/20/23 223 lb 6.4 oz (101.3 kg)  11/10/22 233 lb (105.7 kg)     Physical Exam Vitals reviewed.  Constitutional:      Appearance: He is well-developed.  HENT:     Head: Normocephalic and atraumatic.     Right Ear: External ear normal.     Left Ear: External ear normal.     Mouth/Throat:     Pharynx: No oropharyngeal exudate or posterior oropharyngeal erythema.  Eyes:     Pupils: Pupils are equal, round, and reactive to light.  Cardiovascular:     Rate and Rhythm: Normal rate and regular rhythm.     Heart sounds: No murmur heard. Pulmonary:     Effort: No respiratory distress.     Breath sounds: Normal breath sounds.  Musculoskeletal:     Cervical back: Normal range of motion and neck supple.  Neurological:     Mental Status: He is alert and oriented to person, place, and time.  Assessment & Plan:   Karden was seen today for hearing problem, numbness, toe pain and medication refill.  Diagnoses and all orders for this visit:  Controlled substance agreement signed -     ToxASSURE Select 13 (MW), Urine -     CBC with Differential/Platelet -     CMP14+EGFR  Generalized anxiety disorder -     clonazePAM (KLONOPIN) 1 MG tablet; TAKE 1 AND 1/2 TABLETS DAILY BY MOUTH AT BEDTIME -     CBC with Differential/Platelet -     CMP14+EGFR  Essential hypertension -     valsartan (DIOVAN) 320 MG tablet; Take 1 tablet (320 mg total) by mouth daily. for blood pressure -     CBC with Differential/Platelet -     CMP14+EGFR -     Lipid panel  Chronic gout without tophus,  unspecified cause, unspecified site -     allopurinol (ZYLOPRIM) 100 MG tablet; Take 1 tablet (100 mg total) by mouth daily. -     CBC with Differential/Platelet -     CMP14+EGFR -     Uric acid  Hydradenitis  Numbness and tingling in left hand  Other orders -     methylPREDNISolone (MEDROL) 4 MG tablet; 5 daily for 3 days, then 4 daily for 3 days, then 3 daily for three days, then 2 daily for 3 days ten one daily for 3 days -     mupirocin ointment (BACTROBAN) 2 %; Apply and cover with bandage twice daily   Allergies as of 12/09/2023       Reactions   Penicillins    Prednisone    Rage, allergic reaction        Medication List        Accurate as of December 09, 2023 11:59 PM. If you have any questions, ask your nurse or doctor.          allopurinol 100 MG tablet Commonly known as: ZYLOPRIM Take 1 tablet (100 mg total) by mouth daily.   clonazePAM 1 MG tablet Commonly known as: KLONOPIN TAKE 1 AND 1/2 TABLETS DAILY BY MOUTH AT BEDTIME   diltiazem 360 MG 24 hr capsule Commonly known as: CARDIZEM CD Take 1 capsule (360 mg total) by mouth daily.   methylPREDNISolone 4 MG tablet Commonly known as: MEDROL 5 daily for 3 days, then 4 daily for 3 days, then 3 daily for three days, then 2 daily for 3 days ten one daily for 3 days Started by: Brandn Mcgath   mupirocin ointment 2 % Commonly known as: Health visitor and cover with bandage twice daily   valsartan 320 MG tablet Commonly known as: DIOVAN Take 1 tablet (320 mg total) by mouth daily. for blood pressure        Meds ordered this encounter  Medications   allopurinol (ZYLOPRIM) 100 MG tablet    Sig: Take 1 tablet (100 mg total) by mouth daily.    Dispense:  90 tablet    Refill:  3   clonazePAM (KLONOPIN) 1 MG tablet    Sig: TAKE 1 AND 1/2 TABLETS DAILY BY MOUTH AT BEDTIME    Dispense:  45 tablet    Refill:  4   valsartan (DIOVAN) 320 MG tablet    Sig: Take 1 tablet (320 mg total) by mouth daily.  for blood pressure    Dispense:  90 tablet    Refill:  3   methylPREDNISolone (MEDROL) 4 MG tablet    Sig: 5 daily for 3 days,  then 4 daily for 3 days, then 3 daily for three days, then 2 daily for 3 days ten one daily for 3 days    Dispense:  45 tablet    Refill:  0   mupirocin ointment (BACTROBAN) 2 %    Sig: Apply and cover with bandage twice daily    Dispense:  30 g    Refill:  5      Follow-up: Return in about 6 months (around 06/07/2024).  Mechele Claude, M.D.

## 2023-12-11 ENCOUNTER — Encounter: Payer: Self-pay | Admitting: Family Medicine

## 2023-12-11 LAB — URIC ACID: Uric Acid: 6.1 mg/dL (ref 3.8–8.4)

## 2023-12-11 LAB — CBC WITH DIFFERENTIAL/PLATELET
Basophils Absolute: 0.1 10*3/uL (ref 0.0–0.2)
Basos: 1 %
EOS (ABSOLUTE): 0 10*3/uL (ref 0.0–0.4)
Eos: 0 %
Hematocrit: 46.2 % (ref 37.5–51.0)
Hemoglobin: 15.6 g/dL (ref 13.0–17.7)
Immature Grans (Abs): 0 10*3/uL (ref 0.0–0.1)
Immature Granulocytes: 0 %
Lymphocytes Absolute: 2.1 10*3/uL (ref 0.7–3.1)
Lymphs: 26 %
MCH: 31.1 pg (ref 26.6–33.0)
MCHC: 33.8 g/dL (ref 31.5–35.7)
MCV: 92 fL (ref 79–97)
Monocytes Absolute: 0.6 10*3/uL (ref 0.1–0.9)
Monocytes: 8 %
Neutrophils Absolute: 5.2 10*3/uL (ref 1.4–7.0)
Neutrophils: 65 %
Platelets: 258 10*3/uL (ref 150–450)
RBC: 5.02 x10E6/uL (ref 4.14–5.80)
RDW: 13.1 % (ref 11.6–15.4)
WBC: 8 10*3/uL (ref 3.4–10.8)

## 2023-12-11 LAB — CMP14+EGFR
ALT: 25 [IU]/L (ref 0–44)
AST: 25 [IU]/L (ref 0–40)
Albumin: 5.1 g/dL (ref 4.1–5.1)
Alkaline Phosphatase: 90 [IU]/L (ref 44–121)
BUN/Creatinine Ratio: 11 (ref 9–20)
BUN: 11 mg/dL (ref 6–24)
Bilirubin Total: 0.5 mg/dL (ref 0.0–1.2)
CO2: 21 mmol/L (ref 20–29)
Calcium: 9.8 mg/dL (ref 8.7–10.2)
Chloride: 103 mmol/L (ref 96–106)
Creatinine, Ser: 1.02 mg/dL (ref 0.76–1.27)
Globulin, Total: 2.9 g/dL (ref 1.5–4.5)
Glucose: 90 mg/dL (ref 70–99)
Potassium: 5 mmol/L (ref 3.5–5.2)
Sodium: 142 mmol/L (ref 134–144)
Total Protein: 8 g/dL (ref 6.0–8.5)
eGFR: 92 mL/min/{1.73_m2} (ref >59)

## 2023-12-11 LAB — LIPID PANEL
Chol/HDL Ratio: 4.7 {ratio} (ref 0.0–5.0)
Cholesterol, Total: 203 mg/dL — ABNORMAL HIGH (ref 100–199)
HDL: 43 mg/dL (ref >39)
LDL Chol Calc (NIH): 136 mg/dL — ABNORMAL HIGH (ref 0–99)
Triglycerides: 131 mg/dL (ref 0–149)
VLDL Cholesterol Cal: 24 mg/dL (ref 5–40)

## 2023-12-13 LAB — TOXASSURE SELECT 13 (MW), URINE

## 2023-12-16 ENCOUNTER — Encounter: Payer: Self-pay | Admitting: *Deleted

## 2024-02-20 DIAGNOSIS — G4733 Obstructive sleep apnea (adult) (pediatric): Secondary | ICD-10-CM | POA: Diagnosis not present

## 2024-03-22 DIAGNOSIS — G4733 Obstructive sleep apnea (adult) (pediatric): Secondary | ICD-10-CM | POA: Diagnosis not present

## 2024-04-12 DIAGNOSIS — M67922 Unspecified disorder of synovium and tendon, left upper arm: Secondary | ICD-10-CM | POA: Diagnosis not present

## 2024-04-12 DIAGNOSIS — M778 Other enthesopathies, not elsewhere classified: Secondary | ICD-10-CM | POA: Diagnosis not present

## 2024-04-12 DIAGNOSIS — M25522 Pain in left elbow: Secondary | ICD-10-CM | POA: Diagnosis not present

## 2024-04-21 DIAGNOSIS — G4733 Obstructive sleep apnea (adult) (pediatric): Secondary | ICD-10-CM | POA: Diagnosis not present

## 2024-05-02 DIAGNOSIS — M25522 Pain in left elbow: Secondary | ICD-10-CM | POA: Diagnosis not present

## 2024-05-02 DIAGNOSIS — M66222 Spontaneous rupture of extensor tendons, left upper arm: Secondary | ICD-10-CM | POA: Diagnosis not present

## 2024-05-09 ENCOUNTER — Other Ambulatory Visit: Payer: Self-pay | Admitting: Family Medicine

## 2024-05-09 DIAGNOSIS — F411 Generalized anxiety disorder: Secondary | ICD-10-CM

## 2024-05-16 DIAGNOSIS — Z1331 Encounter for screening for depression: Secondary | ICD-10-CM | POA: Diagnosis not present

## 2024-05-16 DIAGNOSIS — Z6837 Body mass index (BMI) 37.0-37.9, adult: Secondary | ICD-10-CM | POA: Diagnosis not present

## 2024-05-16 DIAGNOSIS — E66812 Obesity, class 2: Secondary | ICD-10-CM | POA: Diagnosis not present

## 2024-05-16 DIAGNOSIS — Z713 Dietary counseling and surveillance: Secondary | ICD-10-CM | POA: Diagnosis not present

## 2024-05-16 DIAGNOSIS — Z7182 Exercise counseling: Secondary | ICD-10-CM | POA: Diagnosis not present

## 2024-05-23 DIAGNOSIS — E66812 Obesity, class 2: Secondary | ICD-10-CM | POA: Diagnosis not present

## 2024-05-23 DIAGNOSIS — E7800 Pure hypercholesterolemia, unspecified: Secondary | ICD-10-CM | POA: Diagnosis not present

## 2024-05-23 DIAGNOSIS — Z13228 Encounter for screening for other metabolic disorders: Secondary | ICD-10-CM | POA: Diagnosis not present

## 2024-06-27 DIAGNOSIS — R632 Polyphagia: Secondary | ICD-10-CM | POA: Diagnosis not present

## 2024-06-27 DIAGNOSIS — Z7182 Exercise counseling: Secondary | ICD-10-CM | POA: Diagnosis not present

## 2024-06-27 DIAGNOSIS — Z713 Dietary counseling and surveillance: Secondary | ICD-10-CM | POA: Diagnosis not present

## 2024-06-27 DIAGNOSIS — E66812 Obesity, class 2: Secondary | ICD-10-CM | POA: Diagnosis not present

## 2024-07-01 DIAGNOSIS — M79601 Pain in right arm: Secondary | ICD-10-CM | POA: Diagnosis not present

## 2024-07-01 DIAGNOSIS — L02413 Cutaneous abscess of right upper limb: Secondary | ICD-10-CM | POA: Diagnosis not present

## 2024-07-11 DIAGNOSIS — Z6835 Body mass index (BMI) 35.0-35.9, adult: Secondary | ICD-10-CM | POA: Diagnosis not present

## 2024-07-11 DIAGNOSIS — Z713 Dietary counseling and surveillance: Secondary | ICD-10-CM | POA: Diagnosis not present

## 2024-07-14 ENCOUNTER — Encounter: Payer: Self-pay | Admitting: Family Medicine

## 2024-07-14 ENCOUNTER — Ambulatory Visit: Admitting: Family Medicine

## 2024-07-14 VITALS — BP 130/77 | HR 92 | Temp 98.4°F | Ht 71.0 in | Wt 220.6 lb

## 2024-07-14 DIAGNOSIS — I10 Essential (primary) hypertension: Secondary | ICD-10-CM

## 2024-07-14 DIAGNOSIS — M1A9XX0 Chronic gout, unspecified, without tophus (tophi): Secondary | ICD-10-CM

## 2024-07-14 DIAGNOSIS — F411 Generalized anxiety disorder: Secondary | ICD-10-CM

## 2024-07-14 DIAGNOSIS — N528 Other male erectile dysfunction: Secondary | ICD-10-CM

## 2024-07-14 LAB — LIPID PANEL

## 2024-07-14 MED ORDER — TADALAFIL 5 MG PO TABS: 5.0000 mg | ORAL_TABLET | Freq: Every day | ORAL | 11 refills | Status: AC

## 2024-07-14 MED ORDER — COLCHICINE 0.6 MG PO TABS: ORAL_TABLET | ORAL | 2 refills | Status: AC

## 2024-07-14 MED ORDER — CLONAZEPAM 1 MG PO TABS: ORAL_TABLET | ORAL | 4 refills | Status: DC

## 2024-07-14 MED ORDER — DILTIAZEM HCL ER COATED BEADS 240 MG PO CP24: 240.0000 mg | ORAL_CAPSULE | Freq: Two times a day (BID) | ORAL | 1 refills | Status: AC

## 2024-07-14 MED ORDER — CIPROFLOXACIN HCL 500 MG PO TABS: 500.0000 mg | ORAL_TABLET | Freq: Two times a day (BID) | ORAL | 0 refills | Status: AC

## 2024-07-15 ENCOUNTER — Other Ambulatory Visit: Payer: Self-pay | Admitting: Family Medicine

## 2024-07-15 LAB — CBC WITH DIFFERENTIAL/PLATELET
Basophils Absolute: 0.1 x10E3/uL (ref 0.0–0.2)
Basos: 1 %
EOS (ABSOLUTE): 0.1 x10E3/uL (ref 0.0–0.4)
Eos: 1 %
Hematocrit: 48.6 % (ref 37.5–51.0)
Hemoglobin: 16.2 g/dL (ref 13.0–17.7)
Immature Grans (Abs): 0.1 x10E3/uL (ref 0.0–0.1)
Immature Granulocytes: 1 %
Lymphocytes Absolute: 2.6 x10E3/uL (ref 0.7–3.1)
Lymphs: 30 %
MCH: 31.3 pg (ref 26.6–33.0)
MCHC: 33.3 g/dL (ref 31.5–35.7)
MCV: 94 fL (ref 79–97)
Monocytes Absolute: 0.8 x10E3/uL (ref 0.1–0.9)
Monocytes: 9 %
Neutrophils Absolute: 5.1 x10E3/uL (ref 1.4–7.0)
Neutrophils: 58 %
Platelets: 277 x10E3/uL (ref 150–450)
RBC: 5.18 x10E6/uL (ref 4.14–5.80)
RDW: 12.9 % (ref 11.6–15.4)
WBC: 8.7 x10E3/uL (ref 3.4–10.8)

## 2024-07-15 LAB — CMP14+EGFR
ALT: 31 IU/L (ref 0–44)
AST: 24 IU/L (ref 0–40)
Albumin: 4.8 g/dL (ref 4.1–5.1)
Alkaline Phosphatase: 96 IU/L (ref 44–121)
BUN/Creatinine Ratio: 13 (ref 9–20)
BUN: 13 mg/dL (ref 6–24)
Bilirubin Total: 0.4 mg/dL (ref 0.0–1.2)
CO2: 23 mmol/L (ref 20–29)
Calcium: 9.9 mg/dL (ref 8.7–10.2)
Chloride: 102 mmol/L (ref 96–106)
Creatinine, Ser: 1.03 mg/dL (ref 0.76–1.27)
Globulin, Total: 2.7 g/dL (ref 1.5–4.5)
Glucose: 90 mg/dL (ref 70–99)
Potassium: 4.5 mmol/L (ref 3.5–5.2)
Sodium: 141 mmol/L (ref 134–144)
Total Protein: 7.5 g/dL (ref 6.0–8.5)
eGFR: 91 mL/min/1.73 (ref >59)

## 2024-07-15 LAB — LIPID PANEL
Cholesterol, Total: 194 mg/dL (ref 100–199)
HDL: 39 mg/dL — AB (ref >39)
LDL CALC COMMENT:: 5 ratio (ref 0.0–5.0)
LDL Chol Calc (NIH): 119 mg/dL — AB (ref 0–99)
Triglycerides: 207 mg/dL — AB (ref 0–149)
VLDL Cholesterol Cal: 36 mg/dL (ref 5–40)

## 2024-07-15 LAB — URIC ACID: Uric Acid: 5.6 mg/dL (ref 3.8–8.4)

## 2024-07-17 NOTE — Progress Notes (Signed)
 Subjective:  Patient ID: Philip Shepard, male    DOB: [birth year]  Age: 48 y.o. MRN: [id number]  CC: 6 MONTH FOLLOW UP, Hypertension, Hearing Problem (RIGHT EAR), Possible Cellulitis on left toe, MOOD (Reports getting irritated easily ), and TASTE (Reports tasting salt all of the time and says he doesn't eat salt)   HPI  Discussed the use of AI scribe software for clinical note transcription with the patient, who gave verbal consent to proceed.  History of Present Illness   Philip Shepard is a 48 year old male with hypertension and erectile dysfunction who presents for follow-up on blood pressure management and erectile dysfunction.  He experiences fluctuating blood pressure with episodes of high readings, particularly when feeling irritated or frustrated. These episodes are accompanied by lightheadedness and dizziness. He is currently on two medications for hypertension.  He is experiencing erectile dysfunction, particularly with maintaining an erection during multiple sexual encounters in a day. His wife is premenopausal, which has increased his sexual activity. He is currently using Viagra for erectile dysfunction.  He has a history of gout with significant pain in the knuckles of his toes, particularly in the big toe. The pain is described as 'ungodly' and occurs more frequently. He is on allopurinol  but suspects that increased muscle mass from working out may be contributing to the gout flares.  He mentions a recent abscess that was previously treated with doxycycline, but he is concerned about it recurring as the base of the abscess has started to harden and become sore again.  He reports issues with his left ear, including popping and decreased hearing, which he attributes to congestion possibly related to his CPAP machine. He has a history of using a CPAP machine for sleep apnea.              07/14/2024    3:43 PM 12/09/2023    4:38 PM 05/20/2023    3:04 PM  Depression screen PHQ 2/9   Decreased Interest 0 0 0  Down, Depressed, Hopeless 0 0 0  PHQ - 2 Score 0 0 0  Altered sleeping  0   Tired, decreased energy  0   Change in appetite  0   Feeling bad or failure about yourself   0   Trouble concentrating  0   Moving slowly or fidgety/restless  0   Suicidal thoughts  0   PHQ-9 Score  0   Difficult doing work/chores  Not difficult at all     History Ray has a past medical history of Anxiety, Hypertension, and Sleep disorder.   He has a past surgical history that includes Knee surgery (Left); carpel tunnel left and right; Kidney stone surgery; and Tonsillectomy and adenoidectomy.   His family history includes Arthritis in his father and mother; Cancer in his mother and paternal grandmother; Diabetes in his mother.He reports that he has been smoking cigarettes. He quit smokeless tobacco use about 7 years ago. He reports that he does not drink alcohol and does not use drugs.    ROS Review of Systems  Constitutional: Negative.   HENT: Negative.    Eyes:  Negative for visual disturbance.  Respiratory:  Negative for cough and shortness of breath.   Cardiovascular:  Negative for chest pain and leg swelling.  Gastrointestinal:  Negative for abdominal pain, diarrhea, nausea and vomiting.  Genitourinary:  Negative for difficulty urinating.  Musculoskeletal:  Negative for arthralgias and myalgias.  Skin:  Negative for rash.  Neurological:  Negative for headaches.  Psychiatric/Behavioral:  Negative for sleep disturbance.     Objective:  BP 130/77   Pulse 92   Temp 98.4 F (36.9 C)   Ht 5' 11 (1.803 m)   Wt 220 lb 9.6 oz (100.1 kg)   SpO2 97%   BMI 30.77 kg/m   BP Readings from Last 3 Encounters:  07/14/24 130/77  12/09/23 123/70  05/20/23 130/72    Wt Readings from Last 3 Encounters:  07/14/24 220 lb 9.6 oz (100.1 kg)  12/09/23 218 lb (98.9 kg)  05/20/23 223 lb 6.4 oz (101.3 kg)     Physical Exam Vitals reviewed.  Constitutional:       Appearance: He is well-developed.  HENT:     Head: Normocephalic and atraumatic.     Right Ear: External ear normal.     Left Ear: External ear normal.     Mouth/Throat:     Pharynx: No oropharyngeal exudate or posterior oropharyngeal erythema.  Eyes:     Pupils: Pupils are equal, round, and reactive to light.  Cardiovascular:     Rate and Rhythm: Normal rate and regular rhythm.     Heart sounds: No murmur heard. Pulmonary:     Effort: No respiratory distress.     Breath sounds: Normal breath sounds.  Musculoskeletal:     Cervical back: Normal range of motion and neck supple.  Neurological:     Mental Status: He is alert and oriented to person, place, and time.      Assessment & Plan:  Essential hypertension -     CBC with Differential/Platelet -     CMP14+EGFR -     Lipid panel  Chronic gout without tophus, unspecified cause, unspecified site -     Uric acid  Generalized anxiety disorder -     clonazePAM ; TAKE 1 AND 1/2 TABLETS DAILY BY MOUTH AT BEDTIME  Dispense: 45 tablet; Refill: 4  Other male erectile dysfunction  Other orders -     dilTIAZem  HCl ER Coated Beads; Take 1 capsule (240 mg total) by mouth in the morning and at bedtime.  Dispense: 180 capsule; Refill: 1 -     Tadalafil ; Take 1 tablet (5 mg total) by mouth daily.  Dispense: 30 tablet; Refill: 11 -     Colchicine ; Take twice daily for gout attack. (may take every two hours up to 6 doses at acute onset)  Dispense: 60 tablet; Refill: 2 -     Ciprofloxacin  HCl; Take 1 tablet (500 mg total) by mouth 2 (two) times daily.  Dispense: 20 tablet; Refill: 0    Assessment and Plan    Hypertension Intermittent elevated blood pressure with symptoms, possibly stress-related. Current medication regimen may need adjustment. - Increase Cardizem  dosage to twice daily with adjusted strength.  Erectile dysfunction Difficulty maintaining erections, possibly due to antihypertensive medications. Prefers Cialis  for its  longer duration. - Prescribe tadalafil  (Cialis ) 5 mg daily. - Discuss insurance coverage for tadalafil .  Gout Painful toe knuckles suggestive of gout. Current allopurinol  may be insufficient. Increased muscle mass may elevate uric acid. - Order uric acid blood test. - Consider increasing allopurinol  dosage based on uric acid levels. - Prescribe colchicine  for acute flares with specific dosing instructions.  Skin abscess, right arm Previous abscess not fully resolved, concerns about recurrence.  Eustachian tube dysfunction, left ear Intermittent hearing issues likely due to Eustachian tube dysfunction, possibly exacerbated by CPAP use. - Refer to ENT specialist for further evaluation. - Discuss potential impact of CPAP machine on  ear symptoms.           Follow-up: No follow-ups on file.  Butler Der, M.D.

## 2024-07-18 ENCOUNTER — Ambulatory Visit: Payer: Self-pay | Admitting: Family Medicine

## 2024-07-18 NOTE — Telephone Encounter (Signed)
 Tried calling patient to give him his lab results but no answer and voicemail was full.

## 2024-08-22 DIAGNOSIS — E66812 Obesity, class 2: Secondary | ICD-10-CM | POA: Diagnosis not present

## 2024-08-22 DIAGNOSIS — Z6835 Body mass index (BMI) 35.0-35.9, adult: Secondary | ICD-10-CM | POA: Diagnosis not present

## 2024-08-22 DIAGNOSIS — Z713 Dietary counseling and surveillance: Secondary | ICD-10-CM | POA: Diagnosis not present

## 2024-08-23 DIAGNOSIS — R632 Polyphagia: Secondary | ICD-10-CM | POA: Diagnosis not present

## 2024-08-23 DIAGNOSIS — Z6835 Body mass index (BMI) 35.0-35.9, adult: Secondary | ICD-10-CM | POA: Diagnosis not present

## 2024-12-08 ENCOUNTER — Other Ambulatory Visit: Payer: Self-pay | Admitting: Family Medicine

## 2024-12-08 DIAGNOSIS — F411 Generalized anxiety disorder: Secondary | ICD-10-CM

## 2024-12-21 ENCOUNTER — Other Ambulatory Visit: Payer: Self-pay | Admitting: Family Medicine

## 2024-12-21 DIAGNOSIS — F411 Generalized anxiety disorder: Secondary | ICD-10-CM

## 2024-12-29 ENCOUNTER — Other Ambulatory Visit: Payer: Self-pay | Admitting: Family Medicine

## 2024-12-29 ENCOUNTER — Ambulatory Visit: Admitting: Family Medicine

## 2024-12-29 DIAGNOSIS — F411 Generalized anxiety disorder: Secondary | ICD-10-CM

## 2024-12-29 MED ORDER — CLONAZEPAM 1 MG PO TABS: ORAL_TABLET | ORAL | 0 refills | Status: AC

## 2025-01-12 ENCOUNTER — Ambulatory Visit: Payer: Self-pay | Admitting: Family Medicine
